# Patient Record
Sex: Female | Born: 1940
Health system: Southern US, Community
[De-identification: ages and names within clinical notes are randomized; demographics above are authoritative.]

## PROBLEM LIST (undated history)

## (undated) DIAGNOSIS — K589 Irritable bowel syndrome without diarrhea: Secondary | ICD-10-CM

## (undated) DIAGNOSIS — I10 Essential (primary) hypertension: Secondary | ICD-10-CM

## (undated) DIAGNOSIS — J449 Chronic obstructive pulmonary disease, unspecified: Secondary | ICD-10-CM

## (undated) DIAGNOSIS — K219 Gastro-esophageal reflux disease without esophagitis: Secondary | ICD-10-CM

## (undated) HISTORY — PX: OOPHORECTOMY: SHX86

## (undated) HISTORY — DX: Essential (primary) hypertension: I10

## (undated) HISTORY — DX: Gastro-esophageal reflux disease without esophagitis: K21.9

## (undated) HISTORY — DX: Chronic obstructive pulmonary disease, unspecified: J44.9

---

## 1997-07-14 ENCOUNTER — Ambulatory Visit (HOSPITAL_COMMUNITY): Admission: RE | Admit: 1997-07-14 | Discharge: 1997-07-14 | Payer: Self-pay | Admitting: Family Medicine

## 1997-09-04 ENCOUNTER — Other Ambulatory Visit: Admission: RE | Admit: 1997-09-04 | Discharge: 1997-09-04 | Payer: Self-pay | Admitting: Family Medicine

## 1999-10-17 ENCOUNTER — Other Ambulatory Visit: Admission: RE | Admit: 1999-10-17 | Discharge: 1999-10-17 | Payer: Self-pay | Admitting: Internal Medicine

## 2007-04-12 ENCOUNTER — Ambulatory Visit: Payer: Self-pay | Admitting: Internal Medicine

## 2007-04-12 ENCOUNTER — Ambulatory Visit: Payer: Self-pay | Admitting: *Deleted

## 2007-04-12 ENCOUNTER — Encounter (INDEPENDENT_AMBULATORY_CARE_PROVIDER_SITE_OTHER): Payer: Self-pay | Admitting: Nurse Practitioner

## 2007-04-12 LAB — CONVERTED CEMR LAB
ALT: 68 units/L — ABNORMAL HIGH (ref 0–35)
Basophils Absolute: 0 10*3/uL (ref 0.0–0.1)
Basophils Relative: 1 % (ref 0–1)
CO2: 23 meq/L (ref 19–32)
Calcium: 10.1 mg/dL (ref 8.4–10.5)
Chloride: 104 meq/L (ref 96–112)
Eosinophils Absolute: 0.1 10*3/uL (ref 0.0–0.7)
Glucose, Bld: 80 mg/dL (ref 70–99)
HDL: 52 mg/dL (ref >39)
Hemoglobin: 13.7 g/dL (ref 12.0–15.0)
MCHC: 32.8 g/dL (ref 30.0–36.0)
Potassium: 4.2 meq/L (ref 3.5–5.3)
RBC: 4.67 M/uL (ref 3.87–5.11)
Sodium: 141 meq/L (ref 135–145)
Total Protein: 7.5 g/dL (ref 6.0–8.3)
Triglycerides: 217 mg/dL — ABNORMAL HIGH (ref <150)

## 2007-06-12 ENCOUNTER — Ambulatory Visit: Payer: Self-pay | Admitting: Internal Medicine

## 2007-08-07 ENCOUNTER — Ambulatory Visit: Payer: Self-pay | Admitting: Internal Medicine

## 2007-08-07 ENCOUNTER — Encounter (INDEPENDENT_AMBULATORY_CARE_PROVIDER_SITE_OTHER): Payer: Self-pay | Admitting: Nurse Practitioner

## 2007-08-07 LAB — CONVERTED CEMR LAB
ALT: 38 units/L — ABNORMAL HIGH (ref 0–35)
BUN: 13 mg/dL (ref 6–23)
CO2: 24 meq/L (ref 19–32)
Calcium: 9.9 mg/dL (ref 8.4–10.5)
Chloride: 107 meq/L (ref 96–112)
Creatinine, Ser: 0.78 mg/dL (ref 0.40–1.20)
Potassium: 4.3 meq/L (ref 3.5–5.3)
VLDL: 31 mg/dL (ref 0–40)

## 2008-11-20 ENCOUNTER — Encounter: Payer: Self-pay | Admitting: Gastroenterology

## 2008-12-08 ENCOUNTER — Encounter: Payer: Self-pay | Admitting: Gastroenterology

## 2009-01-04 ENCOUNTER — Ambulatory Visit: Payer: Self-pay | Admitting: Gastroenterology

## 2009-01-18 ENCOUNTER — Ambulatory Visit: Payer: Self-pay | Admitting: Gastroenterology

## 2009-05-26 ENCOUNTER — Encounter: Admission: RE | Admit: 2009-05-26 | Discharge: 2009-05-26 | Payer: Self-pay | Admitting: Internal Medicine

## 2012-03-15 ENCOUNTER — Other Ambulatory Visit (HOSPITAL_COMMUNITY): Payer: Self-pay | Admitting: *Deleted

## 2012-03-18 ENCOUNTER — Ambulatory Visit (HOSPITAL_COMMUNITY)
Admission: RE | Admit: 2012-03-18 | Discharge: 2012-03-18 | Disposition: A | Discharge status: Home / Self Care | Payer: No Typology Code available for payment source | Source: Ambulatory Visit | Attending: Internal Medicine | Admitting: Internal Medicine

## 2012-03-18 DIAGNOSIS — M810 Age-related osteoporosis without current pathological fracture: Secondary | ICD-10-CM | POA: Insufficient documentation

## 2012-03-18 MED ORDER — ZOLEDRONIC ACID 5 MG/100ML IV SOLN
5.0000 mg | Freq: Once | INTRAVENOUS | Status: AC
  Administered 2012-03-18: 5 mg via INTRAVENOUS

## 2012-03-18 MED ORDER — ZOLEDRONIC ACID 5 MG/100ML IV SOLN
INTRAVENOUS | Status: AC
  Administered 2012-03-18: 5 mg via INTRAVENOUS
  Filled 2012-03-18: qty 100

## 2012-03-26 ENCOUNTER — Encounter (HOSPITAL_COMMUNITY): Payer: Self-pay

## 2013-09-11 ENCOUNTER — Ambulatory Visit (INDEPENDENT_AMBULATORY_CARE_PROVIDER_SITE_OTHER): Payer: Commercial Managed Care - HMO

## 2013-09-11 ENCOUNTER — Ambulatory Visit (INDEPENDENT_AMBULATORY_CARE_PROVIDER_SITE_OTHER): Payer: Commercial Managed Care - HMO | Admitting: Family Medicine

## 2013-09-11 ENCOUNTER — Other Ambulatory Visit: Payer: Self-pay | Admitting: Family Medicine

## 2013-09-11 VITALS — BP 136/78 | HR 86 | Temp 97.7°F | Resp 16 | Ht 63.0 in | Wt 178.2 lb

## 2013-09-11 DIAGNOSIS — M25519 Pain in unspecified shoulder: Secondary | ICD-10-CM

## 2013-09-11 DIAGNOSIS — M7581 Other shoulder lesions, right shoulder: Secondary | ICD-10-CM

## 2013-09-11 DIAGNOSIS — M25511 Pain in right shoulder: Secondary | ICD-10-CM

## 2013-09-11 DIAGNOSIS — M719 Bursopathy, unspecified: Secondary | ICD-10-CM

## 2013-09-11 DIAGNOSIS — M67919 Unspecified disorder of synovium and tendon, unspecified shoulder: Secondary | ICD-10-CM

## 2013-09-11 MED ORDER — MELOXICAM 7.5 MG PO TABS: ORAL_TABLET | ORAL | Status: DC

## 2013-09-11 MED ORDER — HYDROCODONE-ACETAMINOPHEN 5-325 MG PO TABS: 1.0000 | ORAL_TABLET | Freq: Three times a day (TID) | ORAL | Status: DC | PRN

## 2013-09-11 MED ORDER — CYCLOBENZAPRINE HCL 5 MG PO TABS: 5.0000 mg | ORAL_TABLET | Freq: Every evening | ORAL | Status: DC | PRN

## 2013-09-11 NOTE — Patient Instructions (Signed)

## 2013-09-11 NOTE — Progress Notes (Signed)
Chief Complaint:  Chief Complaint  Patient presents with  . Shoulder Pain    R shoulder pain, started this morning worse this afternoon. No injury.    HPI: Renee Luna is a 73 y.o. female who is here for acute onset of  right shoulder pain , woke up with this morning with it, She took 4 ibuprofen since she started having paina nd was able to move the shoulder a little bt better, prior to this the shoulder was stiff and was very painful when she moved it even a little bit.. She thought she heard a pop when she moves it and rolls her shoulder backwards. She had decreased ROm but now has full ROM but it is very painful to move it in certain positions so she tries not to do it. NKI, no repetitive motion  She works as a Electrical engineersecurity guard and os sits and usually plays with her phone . SHe denies any recent heavy lifting, pulling, repetitive motion. She sleeps usually on her back or her stomach but not sure if she may have slept on her right last night. . She has a hx of carpal tunnel, Pain is sharp and nagging, 5/10pain and earlier today was 11/10 . Denies weakness numbness or tinglign traveling down her neck to shoulder.  No prior inuiries.   She also has a 2 year history of left hip pain, can;t sleep on it due to pain, She is able to bear weight on it. She has lower back pain and sciatica as well but has not really dne much for it except lifestyle modifications.      Past Medical History  Diagnosis Date  . GERD (gastroesophageal reflux disease)    History reviewed. No pertinent past surgical history. History   Social History  . Marital Status: Divorced    Spouse Name: N/A    Number of Children: N/A  . Years of Education: N/A   Social History Main Topics  . Smoking status: Current Every Day Smoker -- 0.50 packs/day    Types: Cigarettes  . Smokeless tobacco: None  . Alcohol Use: No  . Drug Use: No  . Sexual Activity: None   Other Topics Concern  . None   Social History  Narrative  . None   Family History  Problem Relation Age of Onset  . Hyperlipidemia Mother   . Hypertension Father   . Diabetes Sister   . Heart disease Brother    No Known Allergies Prior to Admission medications   Medication Sig Start Date End Date Taking? Authorizing Provider  amoxicillin (AMOXIL) 500 MG tablet Take 500 mg by mouth 2 (two) times daily.   Yes Historical Provider, MD  omeprazole (PRILOSEC) 40 MG capsule Take 40 mg by mouth daily.   Yes Historical Provider, MD     ROS: The patient denies fevers, chills, night sweats, unintentional weight loss, chest pain, palpitations, wheezing, dyspnea on exertion, nausea, vomiting, abdominal pain, dysuria, hematuria, melena, acute numbness, weakness, or tingling.   All other systems have been reviewed and were otherwise negative with the exception of those mentioned in the HPI and as above.    PHYSICAL EXAM: Filed Vitals:   09/11/13 1715  BP: 136/78  Pulse: 86  Temp: 97.7 F (36.5 C)  Resp: 16   Filed Vitals:   09/11/13 1715  Height: 5\' 3"  (1.6 m)  Weight: 178 lb 3.2 oz (80.831 kg)   Body mass index is 31.57 kg/(m^2).  General: Alert, no  acute distress HEENT:  Normocephalic, atraumatic, oropharynx patent. EOMI, PERRLA Cardiovascular:  Regular rate and rhythm, no rubs murmurs or gallops.  No Carotid bruits, radial pulse intact. No pedal edema.  Respiratory: Clear to auscultation bilaterally.  No wheezes, rales, or rhonchi.  No cyanosis, no use of accessory musculature GI: No organomegaly, abdomen is soft and non-tender, positive bowel sounds.  No masses. Skin: No rashes. Neurologic: Facial musculature symmetric. Psychiatric: Patient is appropriate throughout our interaction. Lymphatic: No cervical lymphadenopathy Musculoskeletal: Gait intact. Neck exam-neg for spurling, full ROM, nontender Right shoulder -no deformities, no ecchymosis/erythema, no swelling Tender at [facility] jt and pain with adduction Decrease AROM due  to pain in IR/ ER , lift off but Full PROM  Neg Neers, Neg Hawkins Neg Speed Neg Empty Can 5/5 strength, 2/2 DTR brach/tricep Sensation intact  LABS: Results for orders placed in visit on 08/07/07  CONVERTED [facility] LAB      Result Value Ref Range   Sodium 141  135-145 meq/L   Potassium 4.3  3.5-5.3 meq/L   Chloride 107  96-112 meq/L   CO2 24  19-32 meq/L   Glucose, Bld 98  70-99 mg/dL   BUN 13  1-19 mg/dL   Creatinine, Ser 1.47  0.40-1.20 mg/dL   Total Bilirubin 0.7  0.3-1.2 mg/dL   Alkaline Phosphatase 85  39-117 units/L   AST 26  0-37 units/L   ALT 38 (*) 0-35 units/L   Total Protein 7.0  6.0-8.3 g/dL   Albumin 4.7  8.2-9.5 g/dL   Calcium 9.9  6.2-13.0 mg/dL   Cholesterol 865  7-846 mg/dL   Triglycerides 962 (*) <150 mg/dL   HDL 51  >95 mg/dL   Total CHOL/HDL Ratio 3.8 Ratio     VLDL 31  0-40 mg/dL   LDL Cholesterol 284 (*) 0-99 mg/dL     EKG/XRAY:   Primary read interpreted by Dr. Conley Rolls at [facility]. Neg for fx/dislocation + DJD   ASSESSMENT/PLAN: Encounter Diagnoses  Name Primary?  . Right shoulder pain Yes  . Rotator cuff tendonitis, right    ROM exercises She was rx norco, mobic, flexeril IF cont to worsen then consider steroid injection  F.u prn   Gross sideeffects, risk and benefits, and alternatives of medications d/w patient. Patient is aware that all medications have potential sideeffects and we are unable to predict every sideeffect or drug-drug interaction that may occur.  LE, THAO PHUONG, DO 09/11/2013 7:03 PM

## 2014-06-08 DIAGNOSIS — H25013 Cortical age-related cataract, bilateral: Secondary | ICD-10-CM | POA: Diagnosis not present

## 2014-06-08 DIAGNOSIS — H40053 Ocular hypertension, bilateral: Secondary | ICD-10-CM | POA: Diagnosis not present

## 2014-09-01 DIAGNOSIS — H524 Presbyopia: Secondary | ICD-10-CM | POA: Diagnosis not present

## 2014-09-01 DIAGNOSIS — H25013 Cortical age-related cataract, bilateral: Secondary | ICD-10-CM | POA: Diagnosis not present

## 2014-09-01 DIAGNOSIS — H5203 Hypermetropia, bilateral: Secondary | ICD-10-CM | POA: Diagnosis not present

## 2014-09-01 DIAGNOSIS — H40053 Ocular hypertension, bilateral: Secondary | ICD-10-CM | POA: Diagnosis not present

## 2014-09-01 DIAGNOSIS — H2513 Age-related nuclear cataract, bilateral: Secondary | ICD-10-CM | POA: Diagnosis not present

## 2014-12-08 DIAGNOSIS — H40053 Ocular hypertension, bilateral: Secondary | ICD-10-CM | POA: Diagnosis not present

## 2014-12-08 DIAGNOSIS — H40001 Preglaucoma, unspecified, right eye: Secondary | ICD-10-CM | POA: Diagnosis not present

## 2014-12-30 DIAGNOSIS — R8299 Other abnormal findings in urine: Secondary | ICD-10-CM | POA: Diagnosis not present

## 2014-12-30 DIAGNOSIS — M859 Disorder of bone density and structure, unspecified: Secondary | ICD-10-CM | POA: Diagnosis not present

## 2014-12-30 DIAGNOSIS — E785 Hyperlipidemia, unspecified: Secondary | ICD-10-CM | POA: Diagnosis not present

## 2014-12-30 DIAGNOSIS — K219 Gastro-esophageal reflux disease without esophagitis: Secondary | ICD-10-CM | POA: Diagnosis not present

## 2014-12-30 DIAGNOSIS — N390 Urinary tract infection, site not specified: Secondary | ICD-10-CM | POA: Diagnosis not present

## 2015-01-06 DIAGNOSIS — E669 Obesity, unspecified: Secondary | ICD-10-CM | POA: Diagnosis not present

## 2015-01-06 DIAGNOSIS — R05 Cough: Secondary | ICD-10-CM | POA: Diagnosis not present

## 2015-01-06 DIAGNOSIS — G43009 Migraine without aura, not intractable, without status migrainosus: Secondary | ICD-10-CM | POA: Diagnosis not present

## 2015-01-06 DIAGNOSIS — Z0000 Encounter for general adult medical examination without abnormal findings: Secondary | ICD-10-CM | POA: Diagnosis not present

## 2015-01-06 DIAGNOSIS — M859 Disorder of bone density and structure, unspecified: Secondary | ICD-10-CM | POA: Diagnosis not present

## 2015-01-06 DIAGNOSIS — R109 Unspecified abdominal pain: Secondary | ICD-10-CM | POA: Diagnosis not present

## 2015-01-06 DIAGNOSIS — L989 Disorder of the skin and subcutaneous tissue, unspecified: Secondary | ICD-10-CM | POA: Diagnosis not present

## 2015-01-06 DIAGNOSIS — E785 Hyperlipidemia, unspecified: Secondary | ICD-10-CM | POA: Diagnosis not present

## 2015-01-07 ENCOUNTER — Other Ambulatory Visit: Payer: Self-pay | Admitting: Internal Medicine

## 2015-01-07 DIAGNOSIS — R7401 Elevation of levels of liver transaminase levels: Secondary | ICD-10-CM

## 2015-01-07 DIAGNOSIS — R740 Nonspecific elevation of levels of transaminase and lactic acid dehydrogenase [LDH]: Principal | ICD-10-CM

## 2015-01-14 ENCOUNTER — Other Ambulatory Visit: Payer: No Typology Code available for payment source

## 2015-01-14 ENCOUNTER — Ambulatory Visit
Admission: RE | Admit: 2015-01-14 | Discharge: 2015-01-14 | Disposition: A | Discharge status: Home / Self Care | Payer: Commercial Managed Care - HMO | Source: Ambulatory Visit | Attending: Internal Medicine | Admitting: Internal Medicine

## 2015-01-14 DIAGNOSIS — K8020 Calculus of gallbladder without cholecystitis without obstruction: Secondary | ICD-10-CM | POA: Diagnosis not present

## 2015-01-14 DIAGNOSIS — R740 Nonspecific elevation of levels of transaminase and lactic acid dehydrogenase [LDH]: Principal | ICD-10-CM

## 2015-01-14 DIAGNOSIS — K130 Diseases of lips: Secondary | ICD-10-CM | POA: Diagnosis not present

## 2015-01-14 DIAGNOSIS — R7401 Elevation of levels of liver transaminase levels: Secondary | ICD-10-CM

## 2015-01-14 DIAGNOSIS — L821 Other seborrheic keratosis: Secondary | ICD-10-CM | POA: Diagnosis not present

## 2015-02-15 ENCOUNTER — Encounter: Payer: Self-pay | Admitting: Internal Medicine

## 2015-05-13 DIAGNOSIS — H2513 Age-related nuclear cataract, bilateral: Secondary | ICD-10-CM | POA: Diagnosis not present

## 2015-05-13 DIAGNOSIS — H16223 Keratoconjunctivitis sicca, not specified as Sjogren's, bilateral: Secondary | ICD-10-CM | POA: Diagnosis not present

## 2015-05-13 DIAGNOSIS — H25013 Cortical age-related cataract, bilateral: Secondary | ICD-10-CM | POA: Diagnosis not present

## 2015-05-14 DIAGNOSIS — I10 Essential (primary) hypertension: Secondary | ICD-10-CM | POA: Diagnosis not present

## 2015-05-14 DIAGNOSIS — G43009 Migraine without aura, not intractable, without status migrainosus: Secondary | ICD-10-CM | POA: Diagnosis not present

## 2015-05-14 DIAGNOSIS — E668 Other obesity: Secondary | ICD-10-CM | POA: Diagnosis not present

## 2015-05-14 DIAGNOSIS — Z6832 Body mass index (BMI) 32.0-32.9, adult: Secondary | ICD-10-CM | POA: Diagnosis not present

## 2015-05-25 DIAGNOSIS — H16223 Keratoconjunctivitis sicca, not specified as Sjogren's, bilateral: Secondary | ICD-10-CM | POA: Diagnosis not present

## 2015-05-25 DIAGNOSIS — H25013 Cortical age-related cataract, bilateral: Secondary | ICD-10-CM | POA: Diagnosis not present

## 2015-06-02 DIAGNOSIS — H16143 Punctate keratitis, bilateral: Secondary | ICD-10-CM | POA: Diagnosis not present

## 2015-06-02 DIAGNOSIS — H16223 Keratoconjunctivitis sicca, not specified as Sjogren's, bilateral: Secondary | ICD-10-CM | POA: Diagnosis not present

## 2015-06-09 DIAGNOSIS — H16223 Keratoconjunctivitis sicca, not specified as Sjogren's, bilateral: Secondary | ICD-10-CM | POA: Diagnosis not present

## 2015-06-09 DIAGNOSIS — H40001 Preglaucoma, unspecified, right eye: Secondary | ICD-10-CM | POA: Diagnosis not present

## 2015-06-09 DIAGNOSIS — H2513 Age-related nuclear cataract, bilateral: Secondary | ICD-10-CM | POA: Diagnosis not present

## 2015-06-09 DIAGNOSIS — H25013 Cortical age-related cataract, bilateral: Secondary | ICD-10-CM | POA: Diagnosis not present

## 2015-06-29 DIAGNOSIS — H16143 Punctate keratitis, bilateral: Secondary | ICD-10-CM | POA: Diagnosis not present

## 2015-06-29 DIAGNOSIS — H16223 Keratoconjunctivitis sicca, not specified as Sjogren's, bilateral: Secondary | ICD-10-CM | POA: Diagnosis not present

## 2015-08-10 DIAGNOSIS — H16223 Keratoconjunctivitis sicca, not specified as Sjogren's, bilateral: Secondary | ICD-10-CM | POA: Diagnosis not present

## 2015-08-10 DIAGNOSIS — H25013 Cortical age-related cataract, bilateral: Secondary | ICD-10-CM | POA: Diagnosis not present

## 2015-09-03 DIAGNOSIS — E668 Other obesity: Secondary | ICD-10-CM | POA: Diagnosis not present

## 2015-09-03 DIAGNOSIS — I10 Essential (primary) hypertension: Secondary | ICD-10-CM | POA: Diagnosis not present

## 2015-09-03 DIAGNOSIS — G43009 Migraine without aura, not intractable, without status migrainosus: Secondary | ICD-10-CM | POA: Diagnosis not present

## 2015-09-03 DIAGNOSIS — Z6831 Body mass index (BMI) 31.0-31.9, adult: Secondary | ICD-10-CM | POA: Diagnosis not present

## 2015-10-01 DIAGNOSIS — I10 Essential (primary) hypertension: Secondary | ICD-10-CM | POA: Diagnosis not present

## 2015-10-01 DIAGNOSIS — R0602 Shortness of breath: Secondary | ICD-10-CM | POA: Diagnosis not present

## 2015-10-01 DIAGNOSIS — Z6832 Body mass index (BMI) 32.0-32.9, adult: Secondary | ICD-10-CM | POA: Diagnosis not present

## 2015-10-01 DIAGNOSIS — M545 Low back pain: Secondary | ICD-10-CM | POA: Diagnosis not present

## 2015-10-01 DIAGNOSIS — R0789 Other chest pain: Secondary | ICD-10-CM | POA: Diagnosis not present

## 2015-10-04 DIAGNOSIS — I10 Essential (primary) hypertension: Secondary | ICD-10-CM | POA: Diagnosis not present

## 2015-10-04 DIAGNOSIS — R0602 Shortness of breath: Secondary | ICD-10-CM | POA: Diagnosis not present

## 2015-10-12 DIAGNOSIS — I10 Essential (primary) hypertension: Secondary | ICD-10-CM | POA: Diagnosis not present

## 2015-10-12 DIAGNOSIS — R5381 Other malaise: Secondary | ICD-10-CM | POA: Diagnosis not present

## 2015-10-12 DIAGNOSIS — R0602 Shortness of breath: Secondary | ICD-10-CM | POA: Diagnosis not present

## 2015-10-12 DIAGNOSIS — Z6831 Body mass index (BMI) 31.0-31.9, adult: Secondary | ICD-10-CM | POA: Diagnosis not present

## 2015-11-19 DIAGNOSIS — R0602 Shortness of breath: Secondary | ICD-10-CM | POA: Diagnosis not present

## 2015-11-19 DIAGNOSIS — R5381 Other malaise: Secondary | ICD-10-CM | POA: Diagnosis not present

## 2015-11-19 DIAGNOSIS — R0789 Other chest pain: Secondary | ICD-10-CM | POA: Diagnosis not present

## 2015-11-19 DIAGNOSIS — I10 Essential (primary) hypertension: Secondary | ICD-10-CM | POA: Diagnosis not present

## 2015-11-19 DIAGNOSIS — R062 Wheezing: Secondary | ICD-10-CM | POA: Diagnosis not present

## 2015-11-19 DIAGNOSIS — K219 Gastro-esophageal reflux disease without esophagitis: Secondary | ICD-10-CM | POA: Diagnosis not present

## 2015-11-19 DIAGNOSIS — Z6832 Body mass index (BMI) 32.0-32.9, adult: Secondary | ICD-10-CM | POA: Diagnosis not present

## 2015-12-08 DIAGNOSIS — R002 Palpitations: Secondary | ICD-10-CM | POA: Diagnosis not present

## 2015-12-08 DIAGNOSIS — R0602 Shortness of breath: Secondary | ICD-10-CM | POA: Diagnosis not present

## 2015-12-08 DIAGNOSIS — R0789 Other chest pain: Secondary | ICD-10-CM | POA: Diagnosis not present

## 2015-12-08 DIAGNOSIS — I951 Orthostatic hypotension: Secondary | ICD-10-CM | POA: Diagnosis not present

## 2015-12-10 DIAGNOSIS — R0602 Shortness of breath: Secondary | ICD-10-CM | POA: Diagnosis not present

## 2015-12-10 DIAGNOSIS — R002 Palpitations: Secondary | ICD-10-CM | POA: Diagnosis not present

## 2015-12-10 DIAGNOSIS — R0789 Other chest pain: Secondary | ICD-10-CM | POA: Diagnosis not present

## 2015-12-14 DIAGNOSIS — R0602 Shortness of breath: Secondary | ICD-10-CM | POA: Diagnosis not present

## 2015-12-14 DIAGNOSIS — R002 Palpitations: Secondary | ICD-10-CM | POA: Diagnosis not present

## 2015-12-14 DIAGNOSIS — R079 Chest pain, unspecified: Secondary | ICD-10-CM | POA: Diagnosis not present

## 2015-12-17 DIAGNOSIS — R0789 Other chest pain: Secondary | ICD-10-CM | POA: Diagnosis not present

## 2015-12-17 DIAGNOSIS — R002 Palpitations: Secondary | ICD-10-CM | POA: Diagnosis not present

## 2015-12-17 DIAGNOSIS — J449 Chronic obstructive pulmonary disease, unspecified: Secondary | ICD-10-CM | POA: Diagnosis not present

## 2015-12-17 DIAGNOSIS — R0602 Shortness of breath: Secondary | ICD-10-CM | POA: Diagnosis not present

## 2015-12-23 DIAGNOSIS — H2513 Age-related nuclear cataract, bilateral: Secondary | ICD-10-CM | POA: Diagnosis not present

## 2015-12-23 DIAGNOSIS — H16223 Keratoconjunctivitis sicca, not specified as Sjogren's, bilateral: Secondary | ICD-10-CM | POA: Diagnosis not present

## 2015-12-23 DIAGNOSIS — H40001 Preglaucoma, unspecified, right eye: Secondary | ICD-10-CM | POA: Diagnosis not present

## 2016-01-07 DIAGNOSIS — I10 Essential (primary) hypertension: Secondary | ICD-10-CM | POA: Diagnosis not present

## 2016-01-07 DIAGNOSIS — M859 Disorder of bone density and structure, unspecified: Secondary | ICD-10-CM | POA: Diagnosis not present

## 2016-01-07 DIAGNOSIS — E784 Other hyperlipidemia: Secondary | ICD-10-CM | POA: Diagnosis not present

## 2016-01-14 DIAGNOSIS — M4120 Other idiopathic scoliosis, site unspecified: Secondary | ICD-10-CM | POA: Diagnosis not present

## 2016-01-14 DIAGNOSIS — R05 Cough: Secondary | ICD-10-CM | POA: Diagnosis not present

## 2016-01-14 DIAGNOSIS — Z0000 Encounter for general adult medical examination without abnormal findings: Secondary | ICD-10-CM | POA: Diagnosis not present

## 2016-01-14 DIAGNOSIS — M859 Disorder of bone density and structure, unspecified: Secondary | ICD-10-CM | POA: Diagnosis not present

## 2016-01-14 DIAGNOSIS — E668 Other obesity: Secondary | ICD-10-CM | POA: Diagnosis not present

## 2016-01-14 DIAGNOSIS — K219 Gastro-esophageal reflux disease without esophagitis: Secondary | ICD-10-CM | POA: Diagnosis not present

## 2016-01-14 DIAGNOSIS — I10 Essential (primary) hypertension: Secondary | ICD-10-CM | POA: Diagnosis not present

## 2016-01-14 DIAGNOSIS — E784 Other hyperlipidemia: Secondary | ICD-10-CM | POA: Diagnosis not present

## 2016-01-14 DIAGNOSIS — R0602 Shortness of breath: Secondary | ICD-10-CM | POA: Diagnosis not present

## 2016-01-27 ENCOUNTER — Other Ambulatory Visit (HOSPITAL_COMMUNITY): Payer: Self-pay | Admitting: Respiratory Therapy

## 2016-01-27 DIAGNOSIS — R0789 Other chest pain: Secondary | ICD-10-CM

## 2016-01-27 DIAGNOSIS — R0602 Shortness of breath: Secondary | ICD-10-CM

## 2016-03-01 ENCOUNTER — Ambulatory Visit (HOSPITAL_COMMUNITY)
Admission: RE | Admit: 2016-03-01 | Discharge: 2016-03-01 | Disposition: A | Discharge status: Home / Self Care | Payer: Commercial Managed Care - HMO | Source: Ambulatory Visit | Attending: Internal Medicine | Admitting: Internal Medicine

## 2016-03-01 DIAGNOSIS — R0789 Other chest pain: Secondary | ICD-10-CM | POA: Diagnosis not present

## 2016-03-01 DIAGNOSIS — R0602 Shortness of breath: Secondary | ICD-10-CM | POA: Insufficient documentation

## 2016-03-01 LAB — PULMONARY FUNCTION TEST
DL/VA % PRED: 86 %
DL/VA: 4.03 ml/min/mmHg/L
DLCO UNC % PRED: 72 %
DLCO UNC: 16.72 ml/min/mmHg
FEF 25-75 POST: 2.08 L/s
FEF 25-75 Pre: 2.81 L/sec
FEF2575-%Change-Post: -26 %
FEF2575-%PRED-POST: 122 %
FEF2575-%PRED-PRE: 166 %
FEV1-%Change-Post: -5 %
FEV1-%Pred-Post: 98 %
FEV1-%Pred-Pre: 104 %
FEV1-Post: 2.03 L
FEV1-Pre: 2.16 L
FEV1FVC-%Change-Post: 0 %
FEV1FVC-%PRED-PRE: 113 %
FEV6-%CHANGE-POST: -5 %
FEV6-%PRED-PRE: 96 %
FEV6-%Pred-Post: 90 %
FEV6-POST: 2.38 L
FEV6-Pre: 2.53 L
FEV6FVC-%PRED-POST: 105 %
FEV6FVC-%Pred-Pre: 105 %
FVC-%Change-Post: -5 %
FVC-%PRED-POST: 86 %
FVC-%Pred-Pre: 91 %
FVC-PRE: 2.53 L
FVC-Post: 2.38 L
POST FEV6/FVC RATIO: 100 %
PRE FEV1/FVC RATIO: 85 %
Post FEV1/FVC ratio: 85 %
Pre FEV6/FVC Ratio: 100 %
RV % pred: 75 %
RV: 1.66 L
TLC % PRED: 91 %
TLC: 4.5 L

## 2016-03-01 MED ORDER — ALBUTEROL SULFATE (2.5 MG/3ML) 0.083% IN NEBU
2.5000 mg | INHALATION_SOLUTION | Freq: Once | RESPIRATORY_TRACT | Status: AC
  Administered 2016-03-01: 2.5 mg via RESPIRATORY_TRACT

## 2016-04-11 DIAGNOSIS — Z6833 Body mass index (BMI) 33.0-33.9, adult: Secondary | ICD-10-CM | POA: Diagnosis not present

## 2016-04-11 DIAGNOSIS — I10 Essential (primary) hypertension: Secondary | ICD-10-CM | POA: Diagnosis not present

## 2016-04-11 DIAGNOSIS — R5381 Other malaise: Secondary | ICD-10-CM | POA: Diagnosis not present

## 2016-04-21 DIAGNOSIS — I10 Essential (primary) hypertension: Secondary | ICD-10-CM | POA: Diagnosis not present

## 2016-04-21 DIAGNOSIS — Z6832 Body mass index (BMI) 32.0-32.9, adult: Secondary | ICD-10-CM | POA: Diagnosis not present

## 2016-04-21 DIAGNOSIS — R05 Cough: Secondary | ICD-10-CM | POA: Diagnosis not present

## 2016-04-21 DIAGNOSIS — Z1389 Encounter for screening for other disorder: Secondary | ICD-10-CM | POA: Diagnosis not present

## 2016-04-21 DIAGNOSIS — R5381 Other malaise: Secondary | ICD-10-CM | POA: Diagnosis not present

## 2016-06-22 DIAGNOSIS — H2513 Age-related nuclear cataract, bilateral: Secondary | ICD-10-CM | POA: Diagnosis not present

## 2016-06-22 DIAGNOSIS — H16223 Keratoconjunctivitis sicca, not specified as Sjogren's, bilateral: Secondary | ICD-10-CM | POA: Diagnosis not present

## 2016-06-22 DIAGNOSIS — H40003 Preglaucoma, unspecified, bilateral: Secondary | ICD-10-CM | POA: Diagnosis not present

## 2016-06-22 DIAGNOSIS — H25013 Cortical age-related cataract, bilateral: Secondary | ICD-10-CM | POA: Diagnosis not present

## 2016-06-23 DIAGNOSIS — I10 Essential (primary) hypertension: Secondary | ICD-10-CM | POA: Diagnosis not present

## 2016-06-23 DIAGNOSIS — R635 Abnormal weight gain: Secondary | ICD-10-CM | POA: Diagnosis not present

## 2016-06-23 DIAGNOSIS — R5381 Other malaise: Secondary | ICD-10-CM | POA: Diagnosis not present

## 2016-06-23 DIAGNOSIS — R0602 Shortness of breath: Secondary | ICD-10-CM | POA: Diagnosis not present

## 2016-06-23 DIAGNOSIS — Z6833 Body mass index (BMI) 33.0-33.9, adult: Secondary | ICD-10-CM | POA: Diagnosis not present

## 2016-08-10 DIAGNOSIS — Z6833 Body mass index (BMI) 33.0-33.9, adult: Secondary | ICD-10-CM | POA: Diagnosis not present

## 2016-08-10 DIAGNOSIS — I10 Essential (primary) hypertension: Secondary | ICD-10-CM | POA: Diagnosis not present

## 2016-08-10 DIAGNOSIS — F4321 Adjustment disorder with depressed mood: Secondary | ICD-10-CM | POA: Diagnosis not present

## 2016-12-08 DIAGNOSIS — Z6832 Body mass index (BMI) 32.0-32.9, adult: Secondary | ICD-10-CM | POA: Diagnosis not present

## 2016-12-08 DIAGNOSIS — J440 Chronic obstructive pulmonary disease with acute lower respiratory infection: Secondary | ICD-10-CM | POA: Diagnosis not present

## 2016-12-08 DIAGNOSIS — R05 Cough: Secondary | ICD-10-CM | POA: Diagnosis not present

## 2016-12-08 DIAGNOSIS — J449 Chronic obstructive pulmonary disease, unspecified: Secondary | ICD-10-CM | POA: Diagnosis not present

## 2016-12-21 DIAGNOSIS — H25013 Cortical age-related cataract, bilateral: Secondary | ICD-10-CM | POA: Diagnosis not present

## 2016-12-21 DIAGNOSIS — H40003 Preglaucoma, unspecified, bilateral: Secondary | ICD-10-CM | POA: Diagnosis not present

## 2017-01-12 ENCOUNTER — Ambulatory Visit: Payer: Commercial Managed Care - HMO | Admitting: Emergency Medicine

## 2017-01-12 ENCOUNTER — Encounter: Payer: Self-pay | Admitting: Emergency Medicine

## 2017-01-12 ENCOUNTER — Other Ambulatory Visit: Payer: Self-pay

## 2017-01-12 ENCOUNTER — Ambulatory Visit (INDEPENDENT_AMBULATORY_CARE_PROVIDER_SITE_OTHER): Payer: Commercial Managed Care - HMO

## 2017-01-12 VITALS — BP 110/66 | HR 69 | Temp 97.9°F | Resp 16 | Ht 63.0 in | Wt 184.4 lb

## 2017-01-12 DIAGNOSIS — I10 Essential (primary) hypertension: Secondary | ICD-10-CM

## 2017-01-12 DIAGNOSIS — J449 Chronic obstructive pulmonary disease, unspecified: Secondary | ICD-10-CM | POA: Diagnosis not present

## 2017-01-12 NOTE — Patient Instructions (Addendum)
IF you received an x-ray today, you will receive an invoice from [facility]. Please contact [facility] at [phone] with questions or concerns regarding your invoice.   IF you received labwork today, you will receive an invoice from [facility]. Please contact [facility] at [phone] with questions or concerns regarding your invoice.   Our billing staff will not be able to assist you with questions regarding bills from these companies.  You will be contacted with the lab results as soon as they are available. The fastest way to get your results is to activate your My Chart account. Instructions are located on the last page of this paperwork. If you have not heard from [place] regarding the results in 2 weeks, please contact this office.     Chronic Obstructive Pulmonary Disease Chronic obstructive pulmonary disease (COPD) is a long-term (chronic) lung problem. When you have COPD, it is hard for air to get in and out of your lungs. The way your lungs work will never return to normal. Usually the condition gets worse over time. There are things you can do to keep yourself as healthy as possible. Your doctor may treat your condition with:  Medicines.  Quitting smoking, if you smoke.  Rehabilitation. This may involve a team of specialists.  Oxygen.  Exercise and changes to your diet.  Lung surgery.  Comfort measures (palliative care).  Follow these instructions at home: Medicines  Take over-the-counter and prescription medicines only as told by your doctor.  Talk to your doctor before taking any cough or allergy medicines. You may need to avoid medicines that cause your lungs to be dry. Lifestyle  If you smoke, stop. Smoking makes the problem worse. If you need help quitting, ask your doctor.  Avoid being around things that make your breathing worse. This may include smoke, chemicals, and fumes.  Stay active, but remember to also rest.  Learn and use tips  on how to relax.  Make sure you get enough sleep. Most adults need at least 7 hours a night.  Eat healthy foods. Eat smaller meals more often. Rest before meals. Controlled breathing  Learn and use tips on how to control your breathing as told by your doctor. Try: ? Breathing in (inhaling) through your nose for 1 second. Then, pucker your lips and breath out (exhale) through your lips for 2 seconds. ? Putting one hand on your belly (abdomen). Breathe in slowly through your nose for 1 second. Your hand on your belly should move out. Pucker your lips and breathe out slowly through your lips. Your hand on your belly should move in as you breathe out. Controlled coughing  Learn and use controlled coughing to clear mucus from your lungs. The steps are: 1. Lean your head a little forward. 2. Breathe in deeply. 3. Try to hold your breath for 3 seconds. 4. Keep your mouth slightly open while coughing 2 times. 5. Spit any mucus out into a tissue. 6. Rest and do the steps again 1 or 2 times as needed. General instructions  Make sure you get all the shots (vaccines) that your doctor recommends. Ask your doctor about a flu shot and a pneumonia shot.  Use oxygen therapy and therapy to help improve your lungs (pulmonary rehabilitation) if told by your doctor. If you need home oxygen therapy, ask your doctor if you should buy a tool to measure your oxygen level (oximeter).  Make a COPD action plan with your doctor. This helps you know  what to do if you feel worse than usual.  Manage any other conditions you have as told by your doctor.  Avoid going outside when it is very hot, cold, or humid.  Avoid people who have a sickness you can catch (contagious).  Keep all follow-up visits as told by your doctor. This is important. Contact a doctor if:  You cough up more mucus than usual.  There is a change in the color or thickness of the mucus.  It is harder to breathe than usual.  Your breathing  is faster than usual.  You have trouble sleeping.  You need to use your medicines more often than usual.  You have trouble doing your normal activities such as getting dressed or walking around the house. Get help right away if:  You have shortness of breath while resting.  You have shortness of breath that stops you from: ? Being able to talk. ? Doing normal activities.  Your chest hurts for longer than 5 minutes.  Your skin color is more blue than usual.  Your pulse oximeter shows that you have low oxygen for longer than 5 minutes.  You have a fever.  You feel too tired to breathe normally. Summary  Chronic obstructive pulmonary disease (COPD) is a long-term lung problem.  The way your lungs work will never return to normal. Usually the condition gets worse over time. There are things you can do to keep yourself as healthy as possible.  Take over-the-counter and prescription medicines only as told by your doctor.  If you smoke, stop. Smoking makes the problem worse. This information is not intended to replace advice given to you by your health care provider. Make sure you discuss any questions you have with your health care provider. Document Released: 08/02/2007 Document Revised: 07/22/2015 Document Reviewed: 10/10/2012 Elsevier Interactive Patient Education  2017 [employer]  Hypertension Hypertension is another name for high blood pressure. High blood pressure forces your heart to work harder to pump blood. This can cause problems over time. There are two numbers in a blood pressure reading. There is a top number (systolic) over a bottom number (diastolic). It is best to have a blood pressure below 120/80. Healthy choices can help lower your blood pressure. You may need medicine to help lower your blood pressure if:  Your blood pressure cannot be lowered with healthy choices.  Your blood pressure is higher than 130/80.  Follow these instructions at home: Eating  and drinking  If directed, follow the DASH eating plan. This diet includes: ? Filling half of your plate at each meal with fruits and vegetables. ? Filling one quarter of your plate at each meal with whole grains. Whole grains include whole wheat pasta, brown rice, and whole grain bread. ? Eating or drinking low-fat dairy products, such as skim milk or low-fat yogurt. ? Filling one quarter of your plate at each meal with low-fat (lean) proteins. Low-fat proteins include fish, skinless chicken, eggs, beans, and tofu. ? Avoiding fatty meat, cured and processed meat, or chicken with skin. ? Avoiding premade or processed food.  Eat less than 1,500 mg of salt (sodium) a day.  Limit alcohol use to no more than 1 drink a day for nonpregnant women and 2 drinks a day for men. One drink equals 12 oz of beer, 5 oz of wine, or 1 oz of hard liquor. Lifestyle  Work with your doctor to stay at a healthy weight or to lose weight. Ask your doctor what  the best weight is for you.  Get at least 30 minutes of exercise that causes your heart to beat faster (aerobic exercise) most days of the week. This may include walking, swimming, or biking.  Get at least 30 minutes of exercise that strengthens your muscles (resistance exercise) at least 3 days a week. This may include lifting weights or pilates.  Do not use any products that contain nicotine or tobacco. This includes cigarettes and e-cigarettes. If you need help quitting, ask your doctor.  Check your blood pressure at home as told by your doctor.  Keep all follow-up visits as told by your doctor. This is important. Medicines  Take over-the-counter and prescription medicines only as told by your doctor. Follow directions carefully.  Do not skip doses of blood pressure medicine. The medicine does not work as well if you skip doses. Skipping doses also puts you at risk for problems.  Ask your doctor about side effects or reactions to medicines that you  should watch for. Contact a doctor if:  You think you are having a reaction to the medicine you are taking.  You have headaches that keep coming back (recurring).  You feel dizzy.  You have swelling in your ankles.  You have trouble with your vision. Get help right away if:  You get a very bad headache.  You start to feel confused.  You feel weak or numb.  You feel faint.  You get very bad pain in your: ? Chest. ? Belly (abdomen).  You throw up (vomit) more than once.  You have trouble breathing. Summary  Hypertension is another name for high blood pressure.  Making healthy choices can help lower blood pressure. If your blood pressure cannot be controlled with healthy choices, you may need to take medicine. This information is not intended to replace advice given to you by your health care provider. Make sure you discuss any questions you have with your health care provider. Document Released: 08/02/2007 Document Revised: 01/12/2016 Document Reviewed: 01/12/2016 Elsevier Interactive Patient Education  [employer]2018 [employer]

## 2017-01-12 NOTE — Progress Notes (Signed)
Renee Luna 76 y.o.   Chief Complaint  Patient presents with  . Establish Care    discuss COPD and HTN    HISTORY OF PRESENT ILLNESS: This is a 76 y.o. female here to establish care; has h/o HTN and COPD; doing well.  HPI   Prior to Admission medications   Medication Sig Start Date End Date Taking? Authorizing Provider  Budesonide-Formoterol Fumarate (SYMBICORT IN) Inhale 2 (two) times daily into the lungs.   Yes [provider]  losartan (COZAAR) 100 MG tablet Take 100 mg daily by mouth.   Yes [provider]  metoprolol succinate (TOPROL-XL) 50 MG 24 hr tablet Take 50 mg daily by mouth. Take with or immediately following a meal.   Yes [provider]  omeprazole (PRILOSEC) 40 MG capsule Take 40 mg by mouth daily.   Yes [provider]    Not on File  Patient Active Problem List   Diagnosis Date Noted  . Essential hypertension 01/12/2017  . Chronic obstructive pulmonary disease (HCC) 01/12/2017    Past Medical History:  Diagnosis Date  . COPD (chronic obstructive pulmonary disease) (HCC)   . GERD (gastroesophageal reflux disease)   . Hypertension     No past surgical history on file.  Social History   Socioeconomic History  . Marital status: Divorced    Spouse name: Not on file  . Number of children: Not on file  . Years of education: Not on file  . Highest education level: Not on file  Social Needs  . Financial resource strain: Not on file  . Food insecurity - worry: Not on file  . Food insecurity - inability: Not on file  . Transportation needs - medical: Not on file  . Transportation needs - non-medical: Not on file  Occupational History  . Not on file  Tobacco Use  . Smoking status: Former Smoker    Packs/day: 0.50    Years: 20.00    Pack years: 10.00    Types: Cigarettes    Last attempt to quit: 03/30/2014    Years since quitting: 2.7  . Smokeless tobacco: Never Used  Substance and Sexual Activity  . Alcohol  use: No  . Drug use: No  . Sexual activity: Not on file  Other Topics Concern  . Not on file  Social History Narrative  . Not on file    Family History  Problem Relation Age of Onset  . Hyperlipidemia Mother   . Hypertension Father   . Diabetes Sister   . Hypertension Sister   . Cancer Sister        ovarin  . Cancer Sister        ovarin  . Mental illness Sister   . Hypertension Sister   . Hypertension Brother      Review of Systems  Constitutional: Negative.  Negative for chills, fever and weight loss.  HENT: Negative.  Negative for congestion, hearing loss and sore throat.   Eyes: Negative.  Negative for blurred vision, double vision, discharge and redness.  Respiratory: Positive for shortness of breath (DOE). Negative for cough.   Cardiovascular: Negative.  Negative for chest pain, palpitations and leg swelling.  Gastrointestinal: Negative.  Negative for abdominal pain, blood in stool, diarrhea, nausea and vomiting.  Genitourinary: Negative.  Negative for hematuria.  Musculoskeletal: Negative for back pain, myalgias and neck pain.  Skin: Negative.  Negative for rash.  Neurological: Negative.  Negative for dizziness, loss of consciousness and headaches.  Endo/Heme/Allergies: Negative.  All other systems reviewed and are negative.  Vitals:   01/12/17 1103  BP: 110/66  Pulse: 69  Resp: 16  Temp: 97.9 F (36.6 C)  SpO2: 97%     Physical Exam  Constitutional: She is oriented to person, place, and time. She appears well-developed and well-nourished.  HENT:  Head: Normocephalic and atraumatic.  Nose: Nose normal.  Mouth/Throat: Oropharynx is clear and moist.  Eyes: Conjunctivae and EOM are normal. Pupils are equal, round, and reactive to light.  Neck: Normal range of motion. Neck supple. No JVD present. No thyromegaly present.  Cardiovascular: Normal rate, regular rhythm and normal heart sounds.  Pulmonary/Chest: Effort normal and breath sounds normal.    Abdominal: Soft. Bowel sounds are normal. She exhibits no distension. There is no tenderness.  Musculoskeletal: Normal range of motion.  Lymphadenopathy:    She has no cervical adenopathy.  Neurological: She is alert and oriented to person, place, and time. No sensory deficit. She exhibits normal muscle tone. Coordination normal.  Skin: Skin is warm and dry. Capillary refill takes less than 2 seconds. No rash noted.  Psychiatric: She has a normal mood and affect. Her behavior is normal.  Vitals reviewed.    ASSESSMENT & PLAN: Fulton Molelice was seen today for establish care.  Diagnoses and all orders for this visit:  Essential hypertension -     CBC with Differential/Platelet -     Comprehensive metabolic panel -     Hemoglobin A1c -     DG Chest 2 View; Future  Chronic obstructive pulmonary disease, unspecified COPD type (HCC) -     CBC with Differential/Platelet -     Comprehensive metabolic panel -     Hemoglobin A1c -     DG Chest 2 View; Future -     Ambulatory referral to Pulmonology    Patient Instructions       IF you received an x-ray today, you will receive an invoice from [facility]. Please contact [facility] at [phone] with questions or concerns regarding your invoice.   IF you received labwork today, you will receive an invoice from [facility]. Please contact [facility] at [phone] with questions or concerns regarding your invoice.   Our billing staff will not be able to assist you with questions regarding bills from these companies.  You will be contacted with the lab results as soon as they are available. The fastest way to get your results is to activate your My Chart account. Instructions are located on the last page of this paperwork. If you have not heard from us regarding the results in 2 weeks, please contact this office.     Chronic Obstructive Pulmonary Disease Chronic obstructive pulmonary disease (COPD) is a long-term  (chronic) lung problem. When you have COPD, it is hard for air to get in and out of your lungs. The way your lungs work will never return to normal. Usually the condition gets worse over time. There are things you can do to keep yourself as healthy as possible. Your doctor may treat your condition with:  Medicines.  Quitting smoking, if you smoke.  Rehabilitation. This may involve a team of specialists.  Oxygen.  Exercise and changes to your diet.  Lung surgery.  Comfort measures (palliative care).  Follow these instructions at home: Medicines  Take over-the-counter and prescription medicines only as told by your doctor.  Talk to your doctor before taking any cough or allergy medicines. You may need to avoid medicines that cause your  lungs to be dry. Lifestyle  If you smoke, stop. Smoking makes the problem worse. If you need help quitting, ask your doctor.  Avoid being around things that make your breathing worse. This may include smoke, chemicals, and fumes.  Stay active, but remember to also rest.  Learn and use tips on how to relax.  Make sure you get enough sleep. Most adults need at least 7 hours a night.  Eat healthy foods. Eat smaller meals more often. Rest before meals. Controlled breathing  Learn and use tips on how to control your breathing as told by your doctor. Try: ? Breathing in (inhaling) through your nose for 1 second. Then, pucker your lips and breath out (exhale) through your lips for 2 seconds. ? Putting one hand on your belly (abdomen). Breathe in slowly through your nose for 1 second. Your hand on your belly should move out. Pucker your lips and breathe out slowly through your lips. Your hand on your belly should move in as you breathe out. Controlled coughing  Learn and use controlled coughing to clear mucus from your lungs. The steps are: 1. Lean your head a little forward. 2. Breathe in deeply. 3. Try to hold your breath for 3 seconds. 4. Keep  your mouth slightly open while coughing 2 times. 5. Spit any mucus out into a tissue. 6. Rest and do the steps again 1 or 2 times as needed. General instructions  Make sure you get all the shots (vaccines) that your doctor recommends. Ask your doctor about a flu shot and a pneumonia shot.  Use oxygen therapy and therapy to help improve your lungs (pulmonary rehabilitation) if told by your doctor. If you need home oxygen therapy, ask your doctor if you should buy a tool to measure your oxygen level (oximeter).  Make a COPD action plan with your doctor. This helps you know what to do if you feel worse than usual.  Manage any other conditions you have as told by your doctor.  Avoid going outside when it is very hot, cold, or humid.  Avoid people who have a sickness you can catch (contagious).  Keep all follow-up visits as told by your doctor. This is important. Contact a doctor if:  You cough up more mucus than usual.  There is a change in the color or thickness of the mucus.  It is harder to breathe than usual.  Your breathing is faster than usual.  You have trouble sleeping.  You need to use your medicines more often than usual.  You have trouble doing your normal activities such as getting dressed or walking around the house. Get help right away if:  You have shortness of breath while resting.  You have shortness of breath that stops you from: ? Being able to talk. ? Doing normal activities.  Your chest hurts for longer than 5 minutes.  Your skin color is more blue than usual.  Your pulse oximeter shows that you have low oxygen for longer than 5 minutes.  You have a fever.  You feel too tired to breathe normally. Summary  Chronic obstructive pulmonary disease (COPD) is a long-term lung problem.  The way your lungs work will never return to normal. Usually the condition gets worse over time. There are things you can do to keep yourself as healthy as  possible.  Take over-the-counter and prescription medicines only as told by your doctor.  If you smoke, stop. Smoking makes the problem worse. This information is not  intended to replace advice given to you by your health care provider. Make sure you discuss any questions you have with your health care provider. Document Released: 08/02/2007 Document Revised: 07/22/2015 Document Reviewed: 10/10/2012 Elsevier Interactive Patient Education  2017 Elsevier Inc.  Hypertension Hypertension is another name for high blood pressure. High blood pressure forces your heart to work harder to pump blood. This can cause problems over time. There are two numbers in a blood pressure reading. There is a top number (systolic) over a bottom number (diastolic). It is best to have a blood pressure below 120/80. Healthy choices can help lower your blood pressure. You may need medicine to help lower your blood pressure if:  Your blood pressure cannot be lowered with healthy choices.  Your blood pressure is higher than 130/80.  Follow these instructions at home: Eating and drinking  If directed, follow the DASH eating plan. This diet includes: ? Filling half of your plate at each meal with fruits and vegetables. ? Filling one quarter of your plate at each meal with whole grains. Whole grains include whole wheat pasta, brown rice, and whole grain bread. ? Eating or drinking low-fat dairy products, such as skim milk or low-fat yogurt. ? Filling one quarter of your plate at each meal with low-fat (lean) proteins. Low-fat proteins include fish, skinless chicken, eggs, beans, and tofu. ? Avoiding fatty meat, cured and processed meat, or chicken with skin. ? Avoiding premade or processed food.  Eat less than 1,500 mg of salt (sodium) a day.  Limit alcohol use to no more than 1 drink a day for nonpregnant women and 2 drinks a day for men. One drink equals 12 oz of beer, 5 oz of wine, or 1 oz of hard  liquor. Lifestyle  Work with your doctor to stay at a healthy weight or to lose weight. Ask your doctor what the best weight is for you.  Get at least 30 minutes of exercise that causes your heart to beat faster (aerobic exercise) most days of the week. This may include walking, swimming, or biking.  Get at least 30 minutes of exercise that strengthens your muscles (resistance exercise) at least 3 days a week. This may include lifting weights or pilates.  Do not use any products that contain nicotine or tobacco. This includes cigarettes and e-cigarettes. If you need help quitting, ask your doctor.  Check your blood pressure at home as told by your doctor.  Keep all follow-up visits as told by your doctor. This is important. Medicines  Take over-the-counter and prescription medicines only as told by your doctor. Follow directions carefully.  Do not skip doses of blood pressure medicine. The medicine does not work as well if you skip doses. Skipping doses also puts you at risk for problems.  Ask your doctor about side effects or reactions to medicines that you should watch for. Contact a doctor if:  You think you are having a reaction to the medicine you are taking.  You have headaches that keep coming back (recurring).  You feel dizzy.  You have swelling in your ankles.  You have trouble with your vision. Get help right away if:  You get a very bad headache.  You start to feel confused.  You feel weak or numb.  You feel faint.  You get very bad pain in your: ? Chest. ? Belly (abdomen).  You throw up (vomit) more than once.  You have trouble breathing. Summary  Hypertension is another name for  high blood pressure.  Making healthy choices can help lower blood pressure. If your blood pressure cannot be controlled with healthy choices, you may need to take medicine. This information is not intended to replace advice given to you by your health care provider. Make sure  you discuss any questions you have with your health care provider. Document Released: 08/02/2007 Document Revised: 01/12/2016 Document Reviewed: 01/12/2016 Elsevier Interactive Patient Education  2018 Elsevier Inc.      Edwina Barth, MD [facility]

## 2017-01-13 LAB — COMPREHENSIVE METABOLIC PANEL
ALBUMIN: 4.8 g/dL (ref 3.5–4.8)
ALK PHOS: 68 IU/L (ref 39–117)
ALT: 42 IU/L — AB (ref 0–32)
AST: 36 IU/L (ref 0–40)
Albumin/Globulin Ratio: 2.1 (ref 1.2–2.2)
BILIRUBIN TOTAL: 0.5 mg/dL (ref 0.0–1.2)
BUN/Creatinine Ratio: 13 (ref 12–28)
BUN: 12 mg/dL (ref 8–27)
CHLORIDE: 105 mmol/L (ref 96–106)
CO2: 21 mmol/L (ref 20–29)
Calcium: 9.4 mg/dL (ref 8.7–10.3)
Creatinine, Ser: 0.94 mg/dL (ref 0.57–1.00)
GFR calc Af Amer: 69 mL/min/{1.73_m2} (ref >59)
GFR calc non Af Amer: 60 mL/min/{1.73_m2} (ref >59)
GLUCOSE: 93 mg/dL (ref 65–99)
Globulin, Total: 2.3 g/dL (ref 1.5–4.5)
POTASSIUM: 4.1 mmol/L (ref 3.5–5.2)
Sodium: 145 mmol/L — ABNORMAL HIGH (ref 134–144)
Total Protein: 7.1 g/dL (ref 6.0–8.5)

## 2017-01-13 LAB — CBC WITH DIFFERENTIAL/PLATELET
BASOS ABS: 0.1 10*3/uL (ref 0.0–0.2)
Basos: 1 %
EOS (ABSOLUTE): 0.1 10*3/uL (ref 0.0–0.4)
Eos: 1 %
HEMOGLOBIN: 13.4 g/dL (ref 11.1–15.9)
Hematocrit: 41.1 % (ref 34.0–46.6)
Immature Grans (Abs): 0 10*3/uL (ref 0.0–0.1)
Immature Granulocytes: 0 %
LYMPHS ABS: 3.3 10*3/uL — AB (ref 0.7–3.1)
Lymphs: 37 %
MCH: 29.1 pg (ref 26.6–33.0)
MCHC: 32.6 g/dL (ref 31.5–35.7)
MCV: 89 fL (ref 79–97)
MONOCYTES: 6 %
Monocytes Absolute: 0.5 10*3/uL (ref 0.1–0.9)
NEUTROS ABS: 4.9 10*3/uL (ref 1.4–7.0)
Neutrophils: 55 %
PLATELETS: 259 10*3/uL (ref 150–379)
RBC: 4.61 x10E6/uL (ref 3.77–5.28)
RDW: 14.2 % (ref 12.3–15.4)
WBC: 8.8 10*3/uL (ref 3.4–10.8)

## 2017-01-13 LAB — HEMOGLOBIN A1C
ESTIMATED AVERAGE GLUCOSE: 117 mg/dL
Hgb A1c MFr Bld: 5.7 % — ABNORMAL HIGH (ref 4.8–5.6)

## 2017-02-13 ENCOUNTER — Ambulatory Visit: Payer: Medicare HMO | Admitting: Emergency Medicine

## 2017-03-02 ENCOUNTER — Institutional Professional Consult (permissible substitution): Payer: Commercial Managed Care - HMO | Admitting: Pulmonary Disease

## 2017-04-06 ENCOUNTER — Institutional Professional Consult (permissible substitution): Payer: Commercial Managed Care - HMO | Admitting: Pulmonary Disease

## 2017-05-22 DIAGNOSIS — H40001 Preglaucoma, unspecified, right eye: Secondary | ICD-10-CM | POA: Diagnosis not present

## 2017-05-22 DIAGNOSIS — H40003 Preglaucoma, unspecified, bilateral: Secondary | ICD-10-CM | POA: Diagnosis not present

## 2017-05-22 DIAGNOSIS — H25013 Cortical age-related cataract, bilateral: Secondary | ICD-10-CM | POA: Diagnosis not present

## 2017-05-22 DIAGNOSIS — H2513 Age-related nuclear cataract, bilateral: Secondary | ICD-10-CM | POA: Diagnosis not present

## 2017-05-24 ENCOUNTER — Ambulatory Visit: Payer: Medicare HMO | Admitting: Pulmonary Disease

## 2017-05-24 ENCOUNTER — Other Ambulatory Visit (INDEPENDENT_AMBULATORY_CARE_PROVIDER_SITE_OTHER): Payer: Medicare HMO

## 2017-05-24 ENCOUNTER — Institutional Professional Consult (permissible substitution): Payer: Commercial Managed Care - HMO | Admitting: Pulmonary Disease

## 2017-05-24 ENCOUNTER — Encounter: Payer: Self-pay | Admitting: Pulmonary Disease

## 2017-05-24 VITALS — BP 118/72 | HR 88 | Ht 63.5 in | Wt 186.0 lb

## 2017-05-24 DIAGNOSIS — J449 Chronic obstructive pulmonary disease, unspecified: Secondary | ICD-10-CM

## 2017-05-24 LAB — CBC WITH DIFFERENTIAL/PLATELET
BASOS ABS: 0.1 10*3/uL (ref 0.0–0.1)
Basophils Relative: 1 % (ref 0.0–3.0)
EOS ABS: 0.1 10*3/uL (ref 0.0–0.7)
Eosinophils Relative: 1.4 % (ref 0.0–5.0)
HEMATOCRIT: 41.2 % (ref 36.0–46.0)
Hemoglobin: 13.8 g/dL (ref 12.0–15.0)
LYMPHS PCT: 30.9 % (ref 12.0–46.0)
Lymphs Abs: 2.9 10*3/uL (ref 0.7–4.0)
MCHC: 33.6 g/dL (ref 30.0–36.0)
MCV: 85.5 fl (ref 78.0–100.0)
Monocytes Absolute: 0.6 10*3/uL (ref 0.1–1.0)
Monocytes Relative: 6.5 % (ref 3.0–12.0)
NEUTROS ABS: 5.6 10*3/uL (ref 1.4–7.7)
Neutrophils Relative %: 60.2 % (ref 43.0–77.0)
PLATELETS: 259 10*3/uL (ref 150.0–400.0)
RBC: 4.82 Mil/uL (ref 3.87–5.11)
RDW: 13.6 % (ref 11.5–15.5)
WBC: 9.4 10*3/uL (ref 4.0–10.5)

## 2017-05-24 NOTE — Progress Notes (Signed)
Renee Luna    [id number]    [birth year]  Primary Care Physician:Paterson, Reuel Boomaniel, MD  Referring Physician: Jarome MatinPaterson, Daniel, MD [address] [place], [place] [address]  Chief complaint: Consult for COPD  HPI: 77 year old with history of hypertension, allergies.  Diagnosed with COPD in 2018.  She has been maintained on Symbicort which helps with her symptoms.  She tried to stop the steroid inhaler and try anoro but had worsening dyspnea and had to go back on Symbicort  Chief complaint is dyspnea with activity.  Denies dyspnea at rest.  Has nonproductive cough with occasional wheeze.  No mucus production, fevers, chills.  Pets: 2 cats.  No birds, farm animals Occupation: Works as a Engineer, materialssecurity officer for a [employer] Exposures: No known exposures, no mold, hot tub Smoking history: 5 pack-year smoking history.  Quit in 2018 Travel History: Grew up in [place]  [place]  No significant travel.  Outpatient Encounter Medications as of 05/24/2017  Medication Sig  . Budesonide-Formoterol Fumarate (SYMBICORT IN) Inhale 2 (two) times daily into the lungs.  Marland Kitchen. buPROPion (WELLBUTRIN SR) 150 MG 12 hr tablet Take 150 mg by mouth every morning.  Marland Kitchen. losartan (COZAAR) 100 MG tablet Take 100 mg daily by mouth.  . metoprolol succinate (TOPROL-XL) 50 MG 24 hr tablet Take 50 mg daily by mouth. Take with or immediately following a meal.  . omeprazole (PRILOSEC) 40 MG capsule Take 40 mg by mouth daily.  . [DISCONTINUED] SYMBICORT 160-4.5 MCG/ACT inhaler INL 1 TO 2 PFS PO BID   No facility-administered encounter medications on file as of 05/24/2017.     Allergies as of 05/24/2017  . (Not on File)    Past Medical History:  Diagnosis Date  . COPD (chronic obstructive pulmonary disease) (HCC)   . GERD (gastroesophageal reflux disease)   . Hypertension     Past Surgical History:  Procedure Laterality Date  . OOPHORECTOMY      Family History  Problem  Relation Age of Onset  . Hyperlipidemia Mother   . Hypertension Father   . Diabetes Sister   . Hypertension Sister   . Cancer Sister        ovarin  . Cancer Sister        ovarin  . Mental illness Sister   . Hypertension Sister   . Hypertension Brother     Social History   Socioeconomic History  . Marital status: Divorced    Spouse name: Not on file  . Number of children: Not on file  . Years of education: Not on file  . Highest education level: Not on file  Occupational History  . Not on file  Social Needs  . Financial resource strain: Not on file  . Food insecurity:    Worry: Not on file    Inability: Not on file  . Transportation needs:    Medical: Not on file    Non-medical: Not on file  Tobacco Use  . Smoking status: Former Smoker    Packs/day: 0.50    Years: 20.00    Pack years: 10.00    Types: Cigarettes    Last attempt to quit: 03/30/2014    Years since quitting: 3.1  . Smokeless tobacco: Never Used  Substance and Sexual Activity  . Alcohol use: No  . Drug use: No  . Sexual activity: Not on file  Lifestyle  . Physical activity:    Days per week: Not on  file    Minutes per session: Not on file  . Stress: Not on file  Relationships  . Social connections:    Talks on phone: Not on file    Gets together: Not on file    Attends religious service: Not on file    Active member of club or organization: Not on file    Attends meetings of clubs or organizations: Not on file    Relationship status: Not on file  . Intimate partner violence:    Fear of current or ex partner: Not on file    Emotionally abused: Not on file    Physically abused: Not on file    Forced sexual activity: Not on file  Other Topics Concern  . Not on file  Social History Narrative  . Not on file    Review of systems: Review of Systems  Constitutional: Negative for fever and chills.  HENT: Negative.   Eyes: Negative for blurred vision.  Respiratory: as per HPI  Cardiovascular:  Negative for chest pain and palpitations.  Gastrointestinal: Negative for vomiting, diarrhea, blood per rectum. Genitourinary: Negative for dysuria, urgency, frequency and hematuria.  Musculoskeletal: Negative for myalgias, back pain and joint pain.  Skin: Negative for itching and rash.  Neurological: Negative for dizziness, tremors, focal weakness, seizures and loss of consciousness.  Endo/Heme/Allergies: Negative for environmental allergies.  Psychiatric/Behavioral: Negative for depression, suicidal ideas and hallucinations.  All other systems reviewed and are negative.  Physical Exam: Blood pressure 118/72, pulse 88, height 5' 3.5" (1.613 m), weight 186 lb (84.4 kg), SpO2 96 %. Gen:      No acute distress HEENT:  EOMI, sclera anicteric Neck:     No masses; no thyromegaly Lungs:    Clear to auscultation bilaterally; normal respiratory effort CV:         Regular rate and rhythm; no murmurs Abd:      + bowel sounds; soft, non-tender; no palpable masses, no distension Ext:    No edema; adequate peripheral perfusion Skin:      Warm and dry; no rash Neuro: alert and oriented x 3 Psych: normal mood and affect  Data Reviewed: CBC differential 01/12/17-WBC 8.8, eos 0.5%, absolute eosinophil count 100  PFTs 03/01/16 FVC 2.38 [86%], FEV1 2.03 [98%], F/F 85, TLC 91%, DLCO 72% Minimal diffusion defect.  Chest x-ray 01/12/17- no active cardiopulmonary disease, scoliosis and kyphosis of thoracolumbar spine.  I have reviewed the images personally.  FENO 05/24/17-24  Assessment:  Consult for COPD Although she has a diagnosis of COPD she has minimal smoking history and PFTs with no significant obstruction. She appears to have done well with Symbicort with worsening of symptoms when the inhaler was stopped.  We will continue the same Evaluate for reactive airway disease, asthma with CBC differential, blood allergy profile and repeat pulmonary function test.  Plan/Recommendations: - Continue  Symbicort - Check CBC differential, blood allergy profile, PFTs  Chilton Greathouse MD [facility] Pager [phone] 05/24/2017, 11:48 AM  CC: Jarome Matin, MD

## 2017-05-24 NOTE — Patient Instructions (Signed)
We will check a FENO today Check CBC differential, blood allergy profile Continue the Symbicort Follow-up in 3 months after pulmonary function test.

## 2017-05-25 LAB — RESPIRATORY ALLERGY PROFILE REGION II ~~LOC~~
Allergen, A. alternata, m6: <0.1 kU/L
Allergen, Cedar tree, t12: <0.1 kU/L
Allergen, Comm Silver Birch, t9: <0.1 kU/L
Allergen, D pternoyssinus,d7: <0.1 kU/L
Allergen, Mouse Urine Protein, e78: <0.1 kU/L
Allergen, Oak,t7: <0.1 kU/L
Allergen, P. notatum, m1: <0.1 kU/L
CLADOSPORIUM HERBARUM (M2) IGE: <0.1 kU/L
CLASS: 0
CLASS: 0
CLASS: 0
CLASS: 0
CLASS: 0
CLASS: 0
CLASS: 0
CLASS: 0
CLASS: 0
COMMON RAGWEED (SHORT) (W1) IGE: <0.1 kU/L
Cat Dander: <0.1 kU/L
Class: 0
Class: 0
Class: 0
Class: 0
Class: 0
Class: 0
Class: 0
Class: 0
Class: 0
Class: 0
Class: 0
Class: 0
Class: 0
Class: 0
Class: 0
Cockroach: <0.1 kU/L
D. farinae: <0.1 kU/L
IgE (Immunoglobulin E), Serum: 411 kU/L — ABNORMAL HIGH (ref <=114)
Pecan/Hickory Tree IgE: <0.1 kU/L
Timothy Grass: <0.1 kU/L

## 2017-05-25 LAB — INTERPRETATION:

## 2017-06-19 DIAGNOSIS — Z6833 Body mass index (BMI) 33.0-33.9, adult: Secondary | ICD-10-CM | POA: Diagnosis not present

## 2017-06-19 DIAGNOSIS — R05 Cough: Secondary | ICD-10-CM | POA: Diagnosis not present

## 2017-06-19 DIAGNOSIS — J449 Chronic obstructive pulmonary disease, unspecified: Secondary | ICD-10-CM | POA: Diagnosis not present

## 2017-06-19 DIAGNOSIS — R5381 Other malaise: Secondary | ICD-10-CM | POA: Diagnosis not present

## 2017-06-19 DIAGNOSIS — N390 Urinary tract infection, site not specified: Secondary | ICD-10-CM | POA: Diagnosis not present

## 2017-06-19 DIAGNOSIS — R0602 Shortness of breath: Secondary | ICD-10-CM | POA: Diagnosis not present

## 2017-07-06 ENCOUNTER — Ambulatory Visit: Payer: Medicare HMO | Admitting: Emergency Medicine

## 2017-07-07 ENCOUNTER — Encounter: Payer: Self-pay | Admitting: Physician Assistant

## 2017-07-07 ENCOUNTER — Ambulatory Visit: Payer: Medicare HMO | Admitting: Physician Assistant

## 2017-07-07 ENCOUNTER — Other Ambulatory Visit: Payer: Self-pay

## 2017-07-07 VITALS — BP 122/72 | HR 83 | Temp 98.0°F | Resp 16 | Ht 63.0 in | Wt 185.6 lb

## 2017-07-07 DIAGNOSIS — R350 Frequency of micturition: Secondary | ICD-10-CM

## 2017-07-07 LAB — POCT WET + KOH PREP
Trich by wet prep: ABSENT
Yeast by KOH: ABSENT
Yeast by wet prep: ABSENT

## 2017-07-07 LAB — POC MICROSCOPIC URINALYSIS (UMFC): MUCUS RE: ABSENT

## 2017-07-07 LAB — POCT URINALYSIS DIP (MANUAL ENTRY)
Bilirubin, UA: NEGATIVE
GLUCOSE UA: NEGATIVE mg/dL
Ketones, POC UA: NEGATIVE mg/dL
Nitrite, UA: NEGATIVE
PROTEIN UA: NEGATIVE mg/dL
Spec Grav, UA: 1.02 (ref 1.010–1.025)
UROBILINOGEN UA: 0.2 U/dL
pH, UA: 6 (ref 5.0–8.0)

## 2017-07-07 MED ORDER — CEPHALEXIN 500 MG PO CAPS: 500.0000 mg | ORAL_CAPSULE | Freq: Three times a day (TID) | ORAL | 0 refills | Status: AC

## 2017-07-07 MED ORDER — CEPHALEXIN 500 MG PO CAPS: 500.0000 mg | ORAL_CAPSULE | Freq: Three times a day (TID) | ORAL | 0 refills | Status: DC

## 2017-07-07 NOTE — Progress Notes (Signed)
07/07/2017 10:14 AM   DOB: 08-Jun-1940 / MRN: [id number]  SUBJECTIVE:  Renee Luna is a 77 y.o. female presenting for solitary episode of "blood in the toilet."  States this occurred during urination.  She is not sure if the blood is coming from her urethra or her vagina.  She has a long history of smoking.  She has a history of multiple UTIs.  She is certain she is not bleeding from her rectum and tells me "my stools have been green."  She has No Known Allergies.   She  has a past medical history of COPD (chronic obstructive pulmonary disease) (HCC), GERD (gastroesophageal reflux disease), and Hypertension.    She  reports that she quit smoking about 3 years ago. Her smoking use included cigarettes. She has a 10.00 pack-year smoking history. She has never used smokeless tobacco. She reports that she does not drink alcohol or use drugs. She  has no sexual activity history on file. The patient  has a past surgical history that includes Oophorectomy.  Her family history includes Cancer in her sister and sister; Diabetes in her sister; Hyperlipidemia in her mother; Hypertension in her brother, father, sister, and sister; Mental illness in her sister.  Review of Systems  Constitutional: Negative for chills, diaphoresis and fever.  Respiratory: Negative for cough, hemoptysis, sputum production, shortness of breath and wheezing.   Cardiovascular: Negative for chest pain, orthopnea and leg swelling.  Gastrointestinal: Negative for nausea.  Skin: Negative for rash.  Neurological: Negative for dizziness.    The problem list and medications were reviewed and updated by myself where necessary and exist elsewhere in the encounter.   OBJECTIVE:  BP 122/72 (BP Location: Right Arm)   Pulse 83   Temp 98 F (36.7 C) (Oral)   Resp 16   Ht  (1.6 m)   Wt 185 lb 9.6 oz (84.2 kg)   SpO2 96%   BMI 32.88 kg/m   Wt Readings from Last 3 Encounters:  07/07/17 185 lb 9.6 oz (84.2 kg)  05/24/17  186 lb (84.4 kg)  01/12/17 184 lb 6.4 oz (83.6 kg)   Temp Readings from Last 3 Encounters:  07/07/17 98 F (36.7 C) (Oral)  01/12/17 97.9 F (36.6 C) (Oral)  09/11/13 97.7 F (36.5 C) (Oral)   BP Readings from Last 3 Encounters:  07/07/17 122/72  05/24/17 118/72  01/12/17 110/66   Pulse Readings from Last 3 Encounters:  07/07/17 83  05/24/17 88  01/12/17 69     Physical Exam  Constitutional: She is oriented to person, place, and time. She appears well-developed.  Eyes: Pupils are equal, round, and reactive to light. EOM are normal.  Cardiovascular: Normal rate. Exam reveals no gallop and no friction rub.  No murmur heard. Pulmonary/Chest: Effort normal and breath sounds normal.  Abdominal: She exhibits no distension and no mass. There is no tenderness. There is no rebound and no guarding. No hernia.  Genitourinary: Vagina normal. No vaginal discharge (negative for bleeding) found.  Musculoskeletal: Normal range of motion.  Neurological: She is alert and oriented to person, place, and time. No cranial nerve deficit.  Skin: Skin is warm and dry. She is not diaphoretic.  Psychiatric: She has a normal mood and affect.  Vitals reviewed.   Results for orders placed or performed in visit on 07/07/17 (from the past 72 hour(s))  POCT urinalysis dipstick     Status: Abnormal   Collection Time: 07/07/17  8:54 AM  Result Value  Ref Range   Color, UA yellow yellow   Clarity, UA clear clear   Glucose, UA negative negative mg/dL   Bilirubin, UA negative negative   Ketones, POC UA negative negative mg/dL   Spec Grav, UA 1.610 9.604 - 1.025   Blood, UA small (A) negative   pH, UA 6.0 5.0 - 8.0   Protein Ur, POC negative negative mg/dL   Urobilinogen, UA 0.2 0.2 or 1.0 E.U./dL   Nitrite, UA Negative Negative   Leukocytes, UA Small (1+) (A) Negative  POCT Microscopic Urinalysis ([facility])     Status: Abnormal   Collection Time: 07/07/17  8:57 AM  Result Value Ref Range    WBC,UR,HPF,POC Moderate (A) None WBC/hpf   RBC,UR,HPF,POC Few (A) None RBC/hpf   Bacteria None None, Too numerous to count   Mucus Absent Absent   Epithelial Cells, UR Per Microscopy Moderate (A) None, Too numerous to count cells/hpf   Lab Results  Component Value Date   CREATININE 0.94 01/12/2017    No results found.  ASSESSMENT AND PLAN:  Davion was seen today for urinary tract infection.  Diagnoses and all orders for this visit:  Urinary frequency: Vaginal exam normal and reassuring.  Likely a UTI.  Will treat and cultures are pending.  She will come back as needed.   -     POCT Microscopic Urinalysis ([facility]) -     POCT urinalysis dipstick -     cephALEXin (KEFLEX) 500 MG capsule; Take 1 capsule (500 mg total) by mouth 3 (three) times daily for 10 days. -     Urine Culture -     POCT Wet + KOH Prep    The patient is advised to call or return to clinic if she does not see an improvement in symptoms, or to seek the care of the closest emergency department if she worsens with the above plan.   Deliah Boston, MHS, PA-C Primary Care at [facility] 07/07/2017 10:14 AM

## 2017-07-07 NOTE — Patient Instructions (Addendum)
Start antibiotics. COme back if you not getting better.     IF you received an x-ray today, you will receive an invoice from [facility]. Please contact [facility] at [phone] with questions or concerns regarding your invoice.   IF you received labwork today, you will receive an invoice from [facility]. Please contact [facility] at [phone] with questions or concerns regarding your invoice.   Our billing staff will not be able to assist you with questions regarding bills from these companies.  You will be contacted with the lab results as soon as they are available. The fastest way to get your results is to activate your My Chart account. Instructions are located on the last page of this paperwork. If you have not heard from [place] regarding the results in 2 weeks, please contact this office.

## 2017-07-08 LAB — URINE CULTURE: Organism ID, Bacteria: NO GROWTH

## 2017-07-09 ENCOUNTER — Encounter: Payer: Self-pay | Admitting: *Deleted

## 2017-07-26 ENCOUNTER — Encounter (HOSPITAL_COMMUNITY): Payer: Self-pay | Admitting: *Deleted

## 2017-07-26 ENCOUNTER — Emergency Department (HOSPITAL_COMMUNITY): Payer: Medicare HMO

## 2017-07-26 ENCOUNTER — Observation Stay (HOSPITAL_COMMUNITY)
Admission: EM | Admit: 2017-07-26 | Discharge: 2017-07-27 | Disposition: A | Discharge status: Home / Self Care | Payer: Medicare HMO | Attending: Family Medicine | Admitting: Family Medicine

## 2017-07-26 DIAGNOSIS — G43909 Migraine, unspecified, not intractable, without status migrainosus: Secondary | ICD-10-CM | POA: Diagnosis not present

## 2017-07-26 DIAGNOSIS — G4489 Other headache syndrome: Secondary | ICD-10-CM | POA: Diagnosis not present

## 2017-07-26 DIAGNOSIS — R519 Headache, unspecified: Secondary | ICD-10-CM | POA: Insufficient documentation

## 2017-07-26 DIAGNOSIS — I10 Essential (primary) hypertension: Secondary | ICD-10-CM | POA: Diagnosis not present

## 2017-07-26 DIAGNOSIS — R270 Ataxia, unspecified: Secondary | ICD-10-CM | POA: Diagnosis not present

## 2017-07-26 DIAGNOSIS — J449 Chronic obstructive pulmonary disease, unspecified: Secondary | ICD-10-CM | POA: Insufficient documentation

## 2017-07-26 DIAGNOSIS — R51 Headache: Secondary | ICD-10-CM

## 2017-07-26 DIAGNOSIS — K219 Gastro-esophageal reflux disease without esophagitis: Secondary | ICD-10-CM | POA: Insufficient documentation

## 2017-07-26 DIAGNOSIS — Z87891 Personal history of nicotine dependence: Secondary | ICD-10-CM | POA: Diagnosis not present

## 2017-07-26 DIAGNOSIS — R52 Pain, unspecified: Secondary | ICD-10-CM | POA: Diagnosis not present

## 2017-07-26 DIAGNOSIS — Z79899 Other long term (current) drug therapy: Secondary | ICD-10-CM | POA: Insufficient documentation

## 2017-07-26 DIAGNOSIS — G459 Transient cerebral ischemic attack, unspecified: Principal | ICD-10-CM | POA: Insufficient documentation

## 2017-07-26 HISTORY — DX: Irritable bowel syndrome without diarrhea: K58.9

## 2017-07-26 LAB — CBC WITH DIFFERENTIAL/PLATELET
Abs Immature Granulocytes: 0.1 10*3/uL (ref 0.0–0.1)
Basophils Absolute: 0.1 10*3/uL (ref 0.0–0.1)
Basophils Relative: 1 %
Eosinophils Absolute: 0.1 10*3/uL (ref 0.0–0.7)
Eosinophils Relative: 1 %
HEMATOCRIT: 38.5 % (ref 36.0–46.0)
HEMOGLOBIN: 12.5 g/dL (ref 12.0–15.0)
IMMATURE GRANULOCYTES: 1 %
LYMPHS ABS: 2 10*3/uL (ref 0.7–4.0)
LYMPHS PCT: 23 %
MCH: 27.8 pg (ref 26.0–34.0)
MCHC: 32.5 g/dL (ref 30.0–36.0)
MCV: 85.7 fL (ref 78.0–100.0)
MONOS PCT: 6 %
Monocytes Absolute: 0.5 10*3/uL (ref 0.1–1.0)
Neutro Abs: 6.2 10*3/uL (ref 1.7–7.7)
Neutrophils Relative %: 68 %
Platelets: 256 10*3/uL (ref 150–400)
RBC: 4.49 MIL/uL (ref 3.87–5.11)
RDW: 13.6 % (ref 11.5–15.5)
WBC: 8.9 10*3/uL (ref 4.0–10.5)

## 2017-07-26 LAB — PROTIME-INR
INR: 1
Prothrombin Time: 13.1 seconds (ref 11.4–15.2)

## 2017-07-26 LAB — BASIC METABOLIC PANEL
Anion gap: 8 (ref 5–15)
BUN: 10 mg/dL (ref 6–20)
CHLORIDE: 111 mmol/L (ref 101–111)
CO2: 24 mmol/L (ref 22–32)
Calcium: 8.9 mg/dL (ref 8.9–10.3)
Creatinine, Ser: 0.82 mg/dL (ref 0.44–1.00)
GFR calc Af Amer: >60 mL/min (ref >60)
GFR calc non Af Amer: >60 mL/min (ref >60)
Glucose, Bld: 120 mg/dL — ABNORMAL HIGH (ref 65–99)
POTASSIUM: 3.6 mmol/L (ref 3.5–5.1)
Sodium: 143 mmol/L (ref 135–145)

## 2017-07-26 LAB — I-STAT TROPONIN, ED: TROPONIN I, POC: 0 ng/mL (ref 0.00–0.08)

## 2017-07-26 MED ORDER — ATORVASTATIN CALCIUM 40 MG PO TABS
40.0000 mg | ORAL_TABLET | Freq: Every day | ORAL | Status: DC
  Filled 2017-07-26 (×2): qty 1

## 2017-07-26 MED ORDER — STROKE: EARLY STAGES OF RECOVERY BOOK
Freq: Once | Status: DC
  Filled 2017-07-26: qty 1

## 2017-07-26 MED ORDER — ASPIRIN 81 MG PO CHEW
81.0000 mg | CHEWABLE_TABLET | Freq: Once | ORAL | Status: AC
  Administered 2017-07-26: 81 mg via ORAL
  Filled 2017-07-26: qty 1

## 2017-07-26 MED ORDER — LORAZEPAM 2 MG/ML IJ SOLN
1.0000 mg | Freq: Once | INTRAMUSCULAR | Status: AC
  Administered 2017-07-26: 1 mg via INTRAVENOUS
  Filled 2017-07-26: qty 1

## 2017-07-26 MED ORDER — TOPIRAMATE 25 MG PO TABS
25.0000 mg | ORAL_TABLET | Freq: Two times a day (BID) | ORAL | Status: DC | PRN
  Administered 2017-07-27: 25 mg via ORAL
  Filled 2017-07-26 (×2): qty 1

## 2017-07-26 MED ORDER — LOSARTAN POTASSIUM 50 MG PO TABS
50.0000 mg | ORAL_TABLET | Freq: Every day | ORAL | Status: DC
  Administered 2017-07-27: 50 mg via ORAL
  Filled 2017-07-26: qty 1

## 2017-07-26 MED ORDER — ACETAMINOPHEN 160 MG/5ML PO SOLN: 650.0000 mg | ORAL | Status: DC | PRN

## 2017-07-26 MED ORDER — GADOBENATE DIMEGLUMINE 529 MG/ML IV SOLN
20.0000 mL | Freq: Once | INTRAVENOUS | Status: AC
  Administered 2017-07-26: 18 mL via INTRAVENOUS

## 2017-07-26 MED ORDER — SODIUM CHLORIDE 0.9 % IV SOLN
INTRAVENOUS | Status: DC
  Administered 2017-07-26: via INTRAVENOUS

## 2017-07-26 MED ORDER — SENNOSIDES-DOCUSATE SODIUM 8.6-50 MG PO TABS: 1.0000 | ORAL_TABLET | Freq: Every evening | ORAL | Status: DC | PRN

## 2017-07-26 MED ORDER — ACETAMINOPHEN 325 MG PO TABS: 650.0000 mg | ORAL_TABLET | ORAL | Status: DC | PRN

## 2017-07-26 MED ORDER — BUDESONIDE-FORMOTEROL FUMARATE 80-4.5 MCG/ACT IN AERO
2.0000 | INHALATION_SPRAY | Freq: Two times a day (BID) | RESPIRATORY_TRACT | Status: DC
  Filled 2017-07-26: qty 6.9

## 2017-07-26 MED ORDER — PANTOPRAZOLE SODIUM 40 MG PO TBEC
40.0000 mg | DELAYED_RELEASE_TABLET | Freq: Every day | ORAL | Status: DC
  Administered 2017-07-27: 40 mg via ORAL
  Filled 2017-07-26: qty 1

## 2017-07-26 MED ORDER — ALBUTEROL SULFATE (2.5 MG/3ML) 0.083% IN NEBU: 2.5000 mg | INHALATION_SOLUTION | RESPIRATORY_TRACT | Status: DC | PRN

## 2017-07-26 MED ORDER — MOMETASONE FURO-FORMOTEROL FUM 100-5 MCG/ACT IN AERO
2.0000 | INHALATION_SPRAY | Freq: Two times a day (BID) | RESPIRATORY_TRACT | Status: DC
  Administered 2017-07-27: 2 via RESPIRATORY_TRACT
  Filled 2017-07-26: qty 8.8

## 2017-07-26 MED ORDER — METOPROLOL SUCCINATE ER 25 MG PO TB24
25.0000 mg | ORAL_TABLET | Freq: Every day | ORAL | Status: DC
  Administered 2017-07-27: 25 mg via ORAL
  Filled 2017-07-26: qty 1

## 2017-07-26 MED ORDER — ENOXAPARIN SODIUM 40 MG/0.4ML ~~LOC~~ SOLN
40.0000 mg | SUBCUTANEOUS | Status: DC
  Administered 2017-07-26: 40 mg via SUBCUTANEOUS
  Filled 2017-07-26: qty 0.4

## 2017-07-26 MED ORDER — ACETAMINOPHEN 650 MG RE SUPP
650.0000 mg | RECTAL | Status: DC | PRN
Start: 2017-07-26 — End: 2017-07-27

## 2017-07-26 NOTE — H&P (Addendum)
Family Medicine Teaching [facility] Admission History and Physical Service Pager: [phone]  Patient name: Renee Luna Medical record number: [id number] Date of birth: March 18, 1940 Age: 77 y.o. Gender: female  Primary Care Provider: Jarome Matin, MD Consultants: Neurology Code Status: full  Chief Complaint: Weakness in legs  Assessment and Plan: Renee Luna is a 77 y.o. female presenting with weakness of legs, trouble standing, and issues with dizziness. Admitted for TIA workup.  Transient ischemic attack  Gait instability Acute. No prior h/o TIA/stroke. Patient with signs and symptoms consistent with TIA given almost complete resolution of symptoms. MRI/MRA of head and neck negative for any acute ischemia. Will perform usual TIA workup. Will get echocardiogram and place on telemetry. Patient already with MRA of neck which did not show any carotid stenosis. Patient with some balance abnormality with eyes closed. Unclear if this is related to TIA or is her baseline. Patient did get ativan during MRI which could be contributory. Starting on aspirin and atorvastatin  for risk start. A1C and lipid panel pending. Not felt to be related to a seizure at all, no indication for EEG at this time. - admit to family medicine teaching service, appropriate for obs, telemetry floor, Dr. Deirdre Priest - follow up neurology recommendations, appreciate their help - starting aspirin  - lovenox for dvt ppx - atorvastatin  - follow up hgb a1c - follow up lipid panel - follow up pt/ot recs  COPD Well controlled. Takes symbicort bid and albuterol prn. Will continue symbicort and albuterol nebs as needed.  Hypertension Maximum blood pressure 167/71 and most recent 148/88. Takes metoprolol  daily, losartan  daily. Continue to monitor. PRN hydralazine as needed for sbp > 180. Could consider increase of metoprolol if persistently increased.  Migraines Takes topiramate  table bid  prn for migraine. Has chronic headache which she has never gotten treatment. - Continue topirimate  table bid - Could consider switch to propranolol for beta-blocker at discharge for migraine ppx  GERD On omeprazole as outpatient. Switching to pantoprazole for formulary.  PMH is significant for COPD, h/o viral encephalitis requiring tracheostomy, hypertension, migraines, GERD  FEN/GI: regular diet after passes swallow study Prophylaxis: lovenox  Disposition: likely home  History of Present Illness:  Renee Luna is a 77 y.o. female presenting after an episode of leg weakness, unsteady gait, and dizziness. 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 She describes these symptoms as "feeling like I just got off of a boat". These symptoms started resolving after around 20-30 minutes. She also describes a pressure in her forehead consistent with a headache around this time.   Patient brought to the emergency department as a stroke workup. By the time she was evaluated by neurology she was back to her baseline neuro status. Had an MRI/MRA head and neck performed which did not show any signs of acute ischemia. Neurology felt that patient could benefit from observation and a TIA workup and family medicine was asked to admit.  Additional workup in the ED consisted of a troponin, bmp, cbc, pt/INR, glucose. All studies normal aside from glucose of 120. EKG showed normal sinus rhythm.   Review Of Systems: Per HPI with the following additions:   Review of Systems  Constitutional: Negative for chills and fever.  HENT: Negative for ear discharge and ear pain.   Eyes: Negative for pain.  Respiratory: Negative for cough and sputum production.   Cardiovascular: Negative for chest pain and  palpitations.  Gastrointestinal: Positive for abdominal pain, diarrhea and vomiting. Negative for constipation and nausea.  Neurological: Positive for  dizziness, focal weakness and headaches. Negative for seizures and weakness.    Patient Active Problem List   Diagnosis Date Noted  . TIA (transient ischemic attack) 07/26/2017  . GERD (gastroesophageal reflux disease) 07/26/2017  . Chronic headaches 07/26/2017  . Essential hypertension 01/12/2017  . Chronic obstructive pulmonary disease (HCC) 01/12/2017    Past Medical History: Past Medical History:  Diagnosis Date  . COPD (chronic obstructive pulmonary disease) (HCC)   . GERD (gastroesophageal reflux disease)   . Hypertension   . IBS (irritable bowel syndrome)     Past Surgical History: Past Surgical History:  Procedure Laterality Date  . OOPHORECTOMY    Tracheostomy  Social History: Social History   Tobacco Use  . Smoking status: Former Smoker    Packs/day: 0.50    Years: 20.00    Pack years: 10.00    Types: Cigarettes    Last attempt to quit: 03/30/2014    Years since quitting: 3.3  . Smokeless tobacco: Never Used  Substance Use Topics  . Alcohol use: No  . Drug use: No    Family History: Family History  Problem Relation Age of Onset  . Hyperlipidemia Mother   . Hypertension Father   . Diabetes Sister   . Hypertension Sister   . Cancer Sister        ovarin  . Cancer Sister        ovarin  . Mental illness Sister   . Hypertension Sister   . Hypertension Brother     Allergies and Medications: No Known Allergies No current facility-administered medications on file prior to encounter.    Current Outpatient Medications on File Prior to Encounter  Medication Sig Dispense Refill  . B Complex-C (B-COMPLEX WITH VITAMIN C) tablet Take 1 tablet by mouth daily.    . Budesonide-Formoterol Fumarate (SYMBICORT IN) Inhale 2 (two) times daily into the lungs.    . calcium citrate-vitamin D (CITRACAL+D) 315-200 MG-UNIT tablet Take 1 tablet by mouth daily.    . Flaxseed, Linseed, (FLAXSEED OIL) 1200 MG CAPS Take 1 capsule by mouth daily.    Marland Kitchen losartan (COZAAR) 100  MG tablet Take 50 mg by mouth daily.     . metoprolol succinate (TOPROL-XL) 25 MG 24 hr tablet Take 25 mg by mouth daily. Take with or immediately following a meal.     . Multiple Vitamins-Minerals (HAIR SKIN AND NAILS FORMULA PO) Take 1 tablet by mouth daily.    . Omega-3 Fatty Acids (FISH OIL) 1200 MG CPDR Take 1 capsule by mouth daily.    Marland Kitchen omeprazole (PRILOSEC) 40 MG capsule Take 40 mg by mouth daily.    Marland Kitchen topiramate (TOPAMAX) 25 MG tablet Take 25 mg by mouth 2 (two) times daily as needed for headache.    . VENTOLIN HFA 108 (90 Base) MCG/ACT inhaler Take 1-2 puffs by mouth 4 (four) times daily as needed for shortness of breath.  4    Objective: BP (!) 148/88   Pulse 66   Temp 97.9 F (36.6 C) (Oral)   Resp 18   SpO2 96%  Exam: General: alert, oriented, no acute distress. Elderly caucasian female sitting up in bed. Eyes: eomi, perrla ENTM: geographic tongue, no tonsillar exudates, no signs of external trauma to ears or head. Scar from former trach site on neck. Neck: range of motion intact Cardiovascular: rrr, palpable pt/dp bilaterally, palpable radial pulse  bilaterally, no m/r/g Respiratory: lungs clear to auscultation bilaterally, no increased work of breathing Gastrointestinal: soft, non-tender, non-distended. MSK: 5/5 strength all muscle groups BUE, BLE. Derm: warm and dry Neuro: cn 2-12 intact, no focal neuro deficits. Diadochokinesia appropriate. Instability with standing with eyes closed. Able to stand on tip toes. Psych: appropriate, pleasant  Labs and Imaging: CBC BMET  Recent Labs  Lab 07/26/17 1228  WBC 8.9  HGB 12.5  HCT 38.5  PLT 256   Recent Labs  Lab 07/26/17 1228  NA 143  K 3.6  CL 111  CO2 24  BUN 10  CREATININE 0.82  GLUCOSE 120*  CALCIUM 8.9      Myrene Buddy, MD 07/26/2017, 4:52 PM PGY-1, [facility] Family Medicine FPTS Intern pager: [phone], text pages welcome  Upper Level Addendum: I have seen and evaluated this patient along  with Dr. Primitivo Gauze and reviewed the above note, making necessary revisions in blue.  Durward Parcel, DO [facility] Family Medicine, PGY-2

## 2017-07-26 NOTE — ED Notes (Signed)
Patient returned from MRI.

## 2017-07-26 NOTE — ED Notes (Signed)
Patient was in MRI and got very anxious, Dr. Donnald Garre aware medications given.

## 2017-07-26 NOTE — Consult Note (Addendum)
NEURO HOSPITALIST CONSULT NOTE   Requestig physician: Dr. Donnald Garre   Reason for Consult: Dizziness   History obtained from:  Patient     HPI:                                                                                                                                          Renee Luna is an 77 y.o. female with history of hypertension, COPD.  States that she was feeling fine this morning, went to work and around 0900 hrs. she suddenly and abruptly felt well pressure on the top of her head and as though her thinking was not as fast as usual.  Patient went to stand up and felt as though something posterior backwards.  She was able to lean forward and grab her desk.  She then started to try to walk and felt as though she was going side to side.  She describes as having "sea legs".  She did not feel as though she was weak in her legs she just could not keep her balance.  Patient denies any visual abnormalities, dysmetria with her arms, numbness or tingling, decreased strength, vertigo, and/or dizziness.  Patient has had viral encephalitis in the past and states she has had a chronic headache since then.  She admits to taking Tylenol or ibuprofen but only when it gets really bad.  Times lasted for approximately 20 minutes and then fully resolved  Past Medical History:  Diagnosis Date  . COPD (chronic obstructive pulmonary disease) (HCC)   . GERD (gastroesophageal reflux disease)   . Hypertension   . IBS (irritable bowel syndrome)     Past Surgical History:  Procedure Laterality Date  . OOPHORECTOMY      Family History  Problem Relation Age of Onset  . Hyperlipidemia Mother   . Hypertension Father   . Diabetes Sister   . Hypertension Sister   . Cancer Sister        ovarin  . Cancer Sister        ovarin  . Mental illness Sister   . Hypertension Sister   . Hypertension Brother           Social History:  reports that she quit smoking about 3 years ago. Her  smoking use included cigarettes. She has a 10.00 pack-year smoking history. She has never used smokeless tobacco. She reports that she does not drink alcohol or use drugs.  No Known Allergies  MEDICATIONS:  No current facility-administered medications for this encounter.    Current Outpatient Medications  Medication Sig Dispense Refill  . Budesonide-Formoterol Fumarate (SYMBICORT IN) Inhale 2 (two) times daily into the lungs.    Marland Kitchen buPROPion (WELLBUTRIN SR) 150 MG 12 hr tablet Take 150 mg by mouth every morning.  5  . losartan (COZAAR) 100 MG tablet Take 100 mg daily by mouth.    . metoprolol succinate (TOPROL-XL) 50 MG 24 hr tablet Take 50 mg daily by mouth. Take with or immediately following a meal.    . omeprazole (PRILOSEC) 40 MG capsule Take 40 mg by mouth daily.        ROS:                                                                                                                                       History obtained from the patient  General ROS: negative for - chills, fatigue, fever, night sweats, weight gain or weight loss Psychological ROS: negative for - behavioral disorder, hallucinations, memory difficulties, mood swings or suicidal ideation Ophthalmic ROS: negative for - blurry vision, double vision, eye pain or loss of vision ENT ROS: negative for - epistaxis, nasal discharge, oral lesions, sore throat, tinnitus or vertigo Allergy and Immunology ROS: negative for - hives or itchy/watery eyes Hematological and Lymphatic ROS: negative for - bleeding problems, bruising or swollen lymph nodes Endocrine ROS: negative for - galactorrhea, hair pattern changes, polydipsia/polyuria or temperature intolerance Respiratory ROS: negative for - cough, hemoptysis, shortness of breath or wheezing Cardiovascular ROS: negative for - chest pain, dyspnea on exertion, edema or  irregular heartbeat Gastrointestinal ROS: negative for - abdominal pain, diarrhea, hematemesis, nausea/vomiting or stool incontinence Genito-Urinary ROS: negative for - dysuria, hematuria, incontinence or urinary frequency/urgency Musculoskeletal ROS: negative for - joint swelling or muscular weakness Neurological ROS: as noted in HPI Dermatological ROS: negative for rash and skin lesion changes   Blood pressure (!) 167/71, pulse (!) 59, temperature 97.9 F (36.6 C), temperature source Oral, resp. rate 15, SpO2 99 %.   General Examination:                                                                                                       Physical Exam  HEENT-  Normocephalic, no lesions, without obvious abnormality.  Normal external eye and conjunctiva.   Abdomen- All 4 quadrants palpated and nontender Extremities- Warm, dry and intact Musculoskeletal-no joint tenderness, deformity or swelling Skin-warm and dry, no hyperpigmentation, vitiligo,  or suspicious lesions  Neurological Examination Mental Status: Alert, oriented, thought content appropriate.  Speech fluent without evidence of aphasia.  Able to follow 3 step commands without difficulty. Cranial Nerves: II: Visual fields grossly normal,  III,IV, VI: ptosis not present, extra-ocular motions intact bilaterally pupils equal, round, reactive to light and accommodation V,VII: smile symmetric, facial light touch sensation normal bilaterally VIII: hearing normal bilaterally IX,X: uvula rises symmetrically XI: bilateral shoulder shrug XII: midline tongue extension Motor: Right : Upper extremity   5/5    Left:     Upper extremity   5/5  Lower extremity   5/5     Lower extremity   5/5 Tone and bulk:normal tone throughout; no atrophy noted Sensory: Pinprick and light touch intact throughout, bilaterally Deep Tendon Reflexes: 2+ and symmetric throughout Plantars: Right: downgoing   Left: downgoing Cerebellar: normal  finger-to-nose,normal heel-to-shin test Gait: Patient has a normal gait and no Romberg.   Lab Results: Basic Metabolic Panel: No results for input(s): NA, K, CL, CO2, GLUCOSE, BUN, CREATININE, CALCIUM, MG, PHOS in the last 168 hours.  CBC: No results for input(s): WBC, NEUTROABS, HGB, HCT, MCV, PLT in the last 168 hours.  Cardiac Enzymes: No results for input(s): CKTOTAL, CKMB, CKMBINDEX, TROPONINI in the last 168 hours.  Lipid Panel: No results for input(s): CHOL, TRIG, HDL, CHOLHDL, VLDL, LDLCALC in the last 168 hours.  Imaging: No results found.  Assessment and plan per attending neurologist  Felicie Morn PA-C [facility] [phone]  07/26/2017, 12:41 PM  I have seen the patient and reviewed the above note.  Assessment 77 year old female who presents with transient disequilibrium without lightheadedness that did not improve when she sat or lay down.  No evidence of peripheral dysfunction on exam, and the duration is longer than I would expect for BPPV.  At this time, my suspicion is that this does represent cerebellar TIA, and I would favor admitting her to work-up for this.  Recommendations: 1. HgbA1c, fasting lipid panel 2. MRI, MRA  of the brain without contrast 3. Frequent neuro checks 4. Echocardiogram 5. Carotid dopplers 6. Prophylactic therapy-Antiplatelet med: Aspirin - dose  PO or  PR  7. Risk factor modification 8. Telemetry monitoring 9. PT consult, OT consult, Speech consult 10. please page stroke NP  Or  PA  Or MD  from 8am -4 pm as this patient will be followed by the stroke team at this point.   You can look them up on [email or web address]     Ritta Slot, MD [facility] [phone]  If 7pm- 7am, please page neurology on call as listed in AMION.

## 2017-07-26 NOTE — ED Triage Notes (Signed)
States she was at work approx. 930  Am she works in a [employer] has a sudden onset pounding pressure type feeling in the top of her head went to stand up and stumbled backwards to the right. Upon ems arrival patients gait was unsteady. Co nausea . Upon  Arrival to ed patient is alert oriented was able to stand and gait was stable patent was able to stand up and was steady on her feet. Denies pain at present

## 2017-07-26 NOTE — ED Provider Notes (Signed)
[facility] EMERGENCY DEPARTMENT Provider Note   CSN: [id number] Arrival date & time: 07/26/17  1101     History   Chief Complaint Chief Complaint  Patient presents with  . Gait Problem    HPI Daysi Boggan is a 77 y.o. female.  HPI Was at work when she got a pressure on the top of her head that she does not qualify as being really painful.  She reports she did not feel the room spinning.  She however felt profoundly off balance.  She reports that she had to hold onto things to keep herself steady and particularly she felt that she is moving to the right.  He denies she has blurred vision or double vision.  She denies was focal weakness numbness or tingling of an extremity.  She reports she is felt well before this started.  She was well yesterday.  Review of systems is negative. Past Medical History:  Diagnosis Date  . COPD (chronic obstructive pulmonary disease) (HCC)   . GERD (gastroesophageal reflux disease)   . Hypertension   . IBS (irritable bowel syndrome)     Patient Active Problem List   Diagnosis Date Noted  . Essential hypertension 01/12/2017  . Chronic obstructive pulmonary disease (HCC) 01/12/2017    Past Surgical History:  Procedure Laterality Date  . OOPHORECTOMY       OB History   None      Home Medications    Prior to Admission medications   Medication Sig Start Date End Date Taking? Authorizing Provider  Budesonide-Formoterol Fumarate (SYMBICORT IN) Inhale 2 (two) times daily into the lungs.   Yes [provider]  losartan (COZAAR) 100 MG tablet Take 50 mg by mouth daily.    Yes [provider]  metoprolol succinate (TOPROL-XL) 25 MG 24 hr tablet Take 25 mg by mouth daily. Take with or immediately following a meal.    Yes [provider]  omeprazole (PRILOSEC) 40 MG capsule Take 40 mg by mouth daily.   Yes [provider]  topiramate (TOPAMAX) 25 MG tablet Take 25 mg by mouth 2 (two) times  daily as needed for headache. 07/24/17   [provider]  VENTOLIN HFA 108 (90 Base) MCG/ACT inhaler Take 1-2 puffs by mouth 4 (four) times daily as needed for shortness of breath. 06/19/17   [provider]    Family History Family History  Problem Relation Age of Onset  . Hyperlipidemia Mother   . Hypertension Father   . Diabetes Sister   . Hypertension Sister   . Cancer Sister        ovarin  . Cancer Sister        ovarin  . Mental illness Sister   . Hypertension Sister   . Hypertension Brother     Social History Social History   Tobacco Use  . Smoking status: Former Smoker    Packs/day: 0.50    Years: 20.00    Pack years: 10.00    Types: Cigarettes    Last attempt to quit: 03/30/2014    Years since quitting: 3.3  . Smokeless tobacco: Never Used  Substance Use Topics  . Alcohol use: No  . Drug use: No     Allergies   Patient has no known allergies.   Review of Systems Review of Systems 10 Systems reviewed and are negative for acute change except as noted in the HPI.  Physical Exam Updated Vital Signs BP (!) 154/70 (BP Location: Left Arm)  Pulse (!) 59   Temp 97.9 F (36.6 C) (Oral)   Resp 18   SpO2 96%   Physical Exam  Constitutional: She is oriented to person, place, and time. She appears well-developed and well-nourished.  HENT:  Head: Normocephalic and atraumatic.  Bilateral TMs normal.  Minor external auditory canal trauma on the left.  Oral cavity normal.  Eyes: Pupils are equal, round, and reactive to light. EOM are normal.  Neck: Neck supple.  Cardiovascular: Normal rate, regular rhythm, normal heart sounds and intact distal pulses.  Pulmonary/Chest: Effort normal and breath sounds normal.  Abdominal: Soft. Bowel sounds are normal. She exhibits no distension. There is no tenderness.  Musculoskeletal: Normal range of motion. She exhibits no edema.  Neurological: She is alert and oriented to person, place, and time. She has  normal strength. No cranial nerve deficit. She exhibits normal muscle tone. Coordination normal. GCS eye subscore is 4. GCS verbal subscore is 5. GCS motor subscore is 6.  Speech and cognitive function normal.  Finger-nose exam normal.  No pronator drift.  Skin: Skin is warm, dry and intact.  Psychiatric: She has a normal mood and affect.     ED Treatments / Results  Labs (all labs ordered are listed, but only abnormal results are displayed) Labs Reviewed  BASIC METABOLIC PANEL - Abnormal; Notable for the following components:      Result Value   Glucose, Bld 120 (*)    All other components within normal limits  CBC WITH DIFFERENTIAL/PLATELET  PROTIME-INR  URINALYSIS, ROUTINE W REFLEX MICROSCOPIC  I-STAT TROPONIN, ED    EKG EKG Interpretation  Date/Time:  Thursday Jul 26 2017 11:12:35 EDT Ventricular Rate:  56 PR Interval:    QRS Duration: 96 QT Interval:  455 QTC Calculation: 440 R Axis:   13 Text Interpretation:  Sinus rhythm Low voltage, precordial leads otherwise normal, no old comparison Confirmed by Arby Barrette [id number]) on 07/26/2017 11:23:59 AM Also confirmed by Arby Barrette [phone]), editor Sheppard Evens ([id number])  on 07/26/2017 1:50:32 PM   Radiology Mr Maxine Glenn Head Wo Contrast  Result Date: 07/26/2017 CLINICAL DATA:  Ataxia, suspect stroke EXAM: MRI HEAD WITHOUT CONTRAST MRA HEAD WITHOUT CONTRAST MRA NECK WITHOUT AND WITH CONTRAST TECHNIQUE: Multiplanar, multiecho pulse sequences of the brain and surrounding structures were obtained without intravenous contrast. Angiographic images of the Circle of Willis were obtained using MRA technique without intravenous contrast. Angiographic images of the neck were obtained using MRA technique without and with intravenous contrast. Carotid stenosis measurements (when applicable) are obtained utilizing NASCET criteria, using the distal internal carotid diameter as the denominator. CONTRAST:  18mL MULTIHANCE GADOBENATE DIMEGLUMINE  529 MG/ML IV SOLN COMPARISON:  None. FINDINGS: MRI HEAD FINDINGS Negative for acute infarct. Mild chronic microvascular ischemic change in the [place] matter. Ventricle size normal. Cerebral volume normal. Negative for hemorrhage or mass. Normal arterial flow void Negative skeletal structures Mild mucosal edema paranasal sinuses.  Normal orbit. MRA HEAD FINDINGS Both vertebral arteries patent to the basilar. Basilar widely patent. Superior cerebellar and posterior cerebral arteries patent without stenosis. Internal carotid artery widely patent bilaterally. Anterior and middle cerebral arteries widely patent and normal. Negative for cerebral aneurysm. MRA NECK FINDINGS Antegrade flow in the carotid and vertebral arteries bilaterally. Carotid bifurcation widely no patent bilaterally without carotid stenosis. Both vertebral arteries widely patent without stenosis. IMPRESSION: 1. No acute intracranial abnormality. Mild chronic microvascular ischemic change in the [place] matter 2. Normal MRA head 3. Normal MRA neck Electronically Signed   By:  Marlan Palau M.D.   On: 07/26/2017 14:38   Mr Angiogram Neck W Or Wo Contrast  Result Date: 07/26/2017 CLINICAL DATA:  Ataxia, suspect stroke EXAM: MRI HEAD WITHOUT CONTRAST MRA HEAD WITHOUT CONTRAST MRA NECK WITHOUT AND WITH CONTRAST TECHNIQUE: Multiplanar, multiecho pulse sequences of the brain and surrounding structures were obtained without intravenous contrast. Angiographic images of the Circle of Willis were obtained using MRA technique without intravenous contrast. Angiographic images of the neck were obtained using MRA technique without and with intravenous contrast. Carotid stenosis measurements (when applicable) are obtained utilizing NASCET criteria, using the distal internal carotid diameter as the denominator. CONTRAST:  18mL MULTIHANCE GADOBENATE DIMEGLUMINE 529 MG/ML IV SOLN COMPARISON:  None. FINDINGS: MRI HEAD FINDINGS Negative for acute infarct. Mild chronic  microvascular ischemic change in the [place] matter. Ventricle size normal. Cerebral volume normal. Negative for hemorrhage or mass. Normal arterial flow void Negative skeletal structures Mild mucosal edema paranasal sinuses.  Normal orbit. MRA HEAD FINDINGS Both vertebral arteries patent to the basilar. Basilar widely patent. Superior cerebellar and posterior cerebral arteries patent without stenosis. Internal carotid artery widely patent bilaterally. Anterior and middle cerebral arteries widely patent and normal. Negative for cerebral aneurysm. MRA NECK FINDINGS Antegrade flow in the carotid and vertebral arteries bilaterally. Carotid bifurcation widely no patent bilaterally without carotid stenosis. Both vertebral arteries widely patent without stenosis. IMPRESSION: 1. No acute intracranial abnormality. Mild chronic microvascular ischemic change in the [place] matter 2. Normal MRA head 3. Normal MRA neck Electronically Signed   By: Marlan Palau M.D.   On: 07/26/2017 14:38   Mr Brain Wo Contrast (neuro Protocol)  Result Date: 07/26/2017 CLINICAL DATA:  Ataxia, suspect stroke EXAM: MRI HEAD WITHOUT CONTRAST MRA HEAD WITHOUT CONTRAST MRA NECK WITHOUT AND WITH CONTRAST TECHNIQUE: Multiplanar, multiecho pulse sequences of the brain and surrounding structures were obtained without intravenous contrast. Angiographic images of the Circle of Willis were obtained using MRA technique without intravenous contrast. Angiographic images of the neck were obtained using MRA technique without and with intravenous contrast. Carotid stenosis measurements (when applicable) are obtained utilizing NASCET criteria, using the distal internal carotid diameter as the denominator. CONTRAST:  18mL MULTIHANCE GADOBENATE DIMEGLUMINE 529 MG/ML IV SOLN COMPARISON:  None. FINDINGS: MRI HEAD FINDINGS Negative for acute infarct. Mild chronic microvascular ischemic change in the [place] matter. Ventricle size normal. Cerebral volume normal. Negative  for hemorrhage or mass. Normal arterial flow void Negative skeletal structures Mild mucosal edema paranasal sinuses.  Normal orbit. MRA HEAD FINDINGS Both vertebral arteries patent to the basilar. Basilar widely patent. Superior cerebellar and posterior cerebral arteries patent without stenosis. Internal carotid artery widely patent bilaterally. Anterior and middle cerebral arteries widely patent and normal. Negative for cerebral aneurysm. MRA NECK FINDINGS Antegrade flow in the carotid and vertebral arteries bilaterally. Carotid bifurcation widely no patent bilaterally without carotid stenosis. Both vertebral arteries widely patent without stenosis. IMPRESSION: 1. No acute intracranial abnormality. Mild chronic microvascular ischemic change in the [place] matter 2. Normal MRA head 3. Normal MRA neck Electronically Signed   By: Marlan Palau M.D.   On: 07/26/2017 14:38    Procedures Procedures (including critical care time) No critical care time Medications Ordered in ED Medications  LORazepam (ATIVAN) injection 1 mg (1 mg Intravenous Given 07/26/17 1337)  gadobenate dimeglumine (MULTIHANCE) injection 20 mL (18 mLs Intravenous Contrast Given 07/26/17 1430)     Initial Impression / Assessment and Plan / ED Course  I have reviewed the triage vital signs and the nursing  notes.  Pertinent labs & imaging results that were available during my care of the patient were reviewed by me and considered in my medical decision making (see chart for details).     Consult: Reviewed with Dr. Onalee Hua.  MRI has been obtained.  Dr. Amada Jupiter has concern for cerebellar TIA.  Recommends inpatient stroke work-up. Consult: Family practice for admission for TIA work-up. Final Clinical Impressions(s) / ED Diagnoses   Final diagnoses:  TIA (transient ischemic attack)  Ataxia  Patient had acute onset of ataxia.  She denied vertiginous symptoms.  She did not have other associated symptoms on review of  systems.  Neurology consult obtained.  Concern for possible cerebellar type TIA.  Plan for admission for full stroke evaluation.  ED Discharge Orders    None       Arby Barrette, MD 07/26/17 [phone]

## 2017-07-27 ENCOUNTER — Other Ambulatory Visit: Payer: Self-pay

## 2017-07-27 ENCOUNTER — Other Ambulatory Visit: Payer: Self-pay | Admitting: Family Medicine

## 2017-07-27 ENCOUNTER — Observation Stay (HOSPITAL_BASED_OUTPATIENT_CLINIC_OR_DEPARTMENT_OTHER): Payer: Medicare HMO

## 2017-07-27 DIAGNOSIS — G459 Transient cerebral ischemic attack, unspecified: Secondary | ICD-10-CM | POA: Diagnosis not present

## 2017-07-27 DIAGNOSIS — R270 Ataxia, unspecified: Secondary | ICD-10-CM | POA: Diagnosis not present

## 2017-07-27 LAB — URINALYSIS, ROUTINE W REFLEX MICROSCOPIC
Bilirubin Urine: NEGATIVE
Glucose, UA: NEGATIVE mg/dL
Hgb urine dipstick: NEGATIVE
Ketones, ur: NEGATIVE mg/dL
NITRITE: NEGATIVE
Protein, ur: NEGATIVE mg/dL
SPECIFIC GRAVITY, URINE: 1.016 (ref 1.005–1.030)
pH: 5 (ref 5.0–8.0)

## 2017-07-27 LAB — COMPREHENSIVE METABOLIC PANEL
ALK PHOS: 55 U/L (ref 38–126)
ALT: 32 U/L (ref 14–54)
ANION GAP: 6 (ref 5–15)
AST: 27 U/L (ref 15–41)
Albumin: 3.5 g/dL (ref 3.5–5.0)
BILIRUBIN TOTAL: 0.7 mg/dL (ref 0.3–1.2)
BUN: 12 mg/dL (ref 6–20)
CALCIUM: 8.5 mg/dL — AB (ref 8.9–10.3)
CO2: 23 mmol/L (ref 22–32)
CREATININE: 0.89 mg/dL (ref 0.44–1.00)
Chloride: 113 mmol/L — ABNORMAL HIGH (ref 101–111)
GFR calc non Af Amer: >60 mL/min (ref >60)
GLUCOSE: 100 mg/dL — AB (ref 65–99)
Potassium: 3.6 mmol/L (ref 3.5–5.1)
SODIUM: 142 mmol/L (ref 135–145)
TOTAL PROTEIN: 5.8 g/dL — AB (ref 6.5–8.1)

## 2017-07-27 LAB — HEMOGLOBIN A1C
Hgb A1c MFr Bld: 5.5 % (ref 4.8–5.6)
Mean Plasma Glucose: 111.15 mg/dL

## 2017-07-27 LAB — LIPID PANEL
CHOL/HDL RATIO: 3.8 ratio
Cholesterol: 148 mg/dL (ref 0–200)
HDL: 39 mg/dL — AB (ref >40)
LDL CALC: 70 mg/dL (ref 0–99)
TRIGLYCERIDES: 197 mg/dL — AB (ref <150)
VLDL: 39 mg/dL (ref 0–40)

## 2017-07-27 LAB — ECHOCARDIOGRAM COMPLETE
Height: 63 in
WEIGHTICAEL: 3019.42 [oz_av]

## 2017-07-27 MED ORDER — CLOPIDOGREL BISULFATE 75 MG PO TABS: 75.0000 mg | ORAL_TABLET | Freq: Every day | ORAL | 0 refills | Status: AC

## 2017-07-27 MED ORDER — ASPIRIN 81 MG PO CHEW
324.0000 mg | CHEWABLE_TABLET | Freq: Every day | ORAL | Status: DC
  Filled 2017-07-27: qty 4

## 2017-07-27 MED ORDER — ASPIRIN EC 81 MG PO TBEC: 81.0000 mg | DELAYED_RELEASE_TABLET | Freq: Every day | ORAL | 0 refills | Status: DC

## 2017-07-27 NOTE — Care Management Note (Signed)
Case Management Note  Patient Details  Name: Renee Luna MRN: [id number] Date of Birth: 11/13/1940  Subjective/Objective:     Pt in with TIA. She is from home with daughter.              Action/Plan: No f/u per PT/OT and no DME needs. Pt has PCP, insurance and transportation home.    Expected Discharge Date:  07/27/17               Expected Discharge Plan:  Home/Self Care  In-House Referral:     Discharge planning Services     Post Acute Care Choice:    Choice offered to:     DME Arranged:    DME Agency:     HH Arranged:    HH Agency:     Status of Service:  Completed, signed off  If discussed at [employer] of Stay Meetings, dates discussed:    Additional Comments:  Kermit Balo, RN 07/27/2017, 2:15 PM

## 2017-07-27 NOTE — Discharge Instructions (Signed)
You were admitted for a transient ischemic attack. The workup did not reveal any cause. You will need to take plavix 75mg  and aspirin 81mg  daily for the next three weeks. At that time you will stop the aspirin 81mg  and continue the plavix daily.  Regarding your blood pressure continue to take the 12.5mg  of toprol XL. Continue losartan 100mg . Please schedule an appointment for the middle of next week with your PCP for a blood pressure check. Please also follow up with neurology in 6 weeks.    Transient Ischemic Attack A transient ischemic attack (TIA) is a "warning stroke" that causes stroke-like symptoms. A TIA does not cause lasting damage to the brain. The symptoms of a TIA can happen fast and do not last long. It is important to know the symptoms of a TIA and what to do. This can help prevent stroke or death. Follow these instructions at home:  Take medicines only as told by your doctor. Make sure you understand all of the instructions.  You may need to take aspirin or warfarin medicine. Warfarin needs to be taken exactly as told. ? Taking too much or too little warfarin is dangerous. Blood tests must be done as often as told by your doctor. A PT blood test measures how long it takes for blood to clot. Your PT is used to calculate another value called an INR. Your PT and INR help your doctor adjust your warfarin dosage. He or she will make sure you are taking the right amount. ? Food can cause problems with warfarin and affect the results of your blood tests. This is true for foods high in vitamin K. Eat the same amount of foods high in vitamin K each day. Foods high in vitamin K include spinach, kale, broccoli, cabbage, collard and turnip greens, Brussels sprouts, peas, cauliflower, seaweed, and parsley. Other foods high in vitamin K include beef and pork liver, green tea, and soybean oil. Eat the same amount of foods high in vitamin K each day. Avoid big changes in your diet. Tell your doctor  before changing your diet. Talk to a food specialist (dietitian) if you have questions. ? Many medicines can cause problems with warfarin and affect your PT and INR. Tell your doctor about all medicines you take. This includes vitamins and dietary pills (supplements). Do not take or stop taking any prescribed or over-the-counter medicines unless your doctor tells you to. ? Warfarin can cause more bruising or bleeding. Hold pressure over any cuts for longer than normal. Talk to your doctor about other side effects of warfarin. ? Avoid sports or activities that may cause injury or bleeding. ? Be careful when you shave, floss, or use sharp objects. ? Avoid or drink very little alcohol while taking warfarin. Tell your doctor if you change how much alcohol you drink. ? Tell your dentist and other doctors that you take warfarin before any procedures.  Follow your diet program as told, if you are given one.  Keep a healthy weight.  Stay active. Try to get at least 30 minutes of activity on all or most days.  Do not use any tobacco products, including cigarettes, chewing tobacco, or electronic cigarettes. If you need help quitting, ask your doctor.  Limit alcohol intake to no more than 1 drink per day for nonpregnant women and 2 drinks per day for men. One drink equals 12 ounces of beer, 5 ounces of wine, or 1 ounces of hard liquor.  Do not abuse drugs.  Keep your home safe so you do not fall. You can do this by: ? Putting grab bars in the bedroom and bathroom. ? Raising toilet seats. ? Putting a seat in the shower.  Keep all follow-up visits as told by your doctor. This is important. Contact a doctor if:  Your personality changes.  You have trouble swallowing.  You have double vision.  You are dizzy.  You have a fever. Get help right away if: These symptoms may be an emergency. Do not wait to see if the symptoms will go away. Get medical help right away. Call your local emergency  services (911 in the [place] Do not drive yourself to the hospital.  You have sudden weakness or lose feeling (go numb), especially on one side of the body. This can affect your: ? Face. ? Arm. ? Leg.  You have sudden trouble walking.  You have sudden trouble moving your arms or legs.  You have sudden confusion.  You have trouble talking.  You have trouble understanding.  You have sudden trouble seeing in one or both eyes.  You lose your balance.  Your movements are not smooth.  You have a sudden, very bad headache with no known cause.  You have new chest pain.  Your heartbeat is unsteady.  You are partly or totally unaware of what is going on around you.  This information is not intended to replace advice given to you by your health care provider. Make sure you discuss any questions you have with your health care provider. Document Released: 11/23/2007 Document Revised: 10/18/2015 Document Reviewed: 05/21/2013 Elsevier Interactive Patient Education  [employer]2018 Elsevier Inc[employer]

## 2017-07-27 NOTE — Progress Notes (Signed)
  Echocardiogram 2D Echocardiogram has been performed.  Belva Chimes 07/27/2017, 10:35 AM

## 2017-07-27 NOTE — Progress Notes (Signed)
SLP Cancellation Note  Patient Details Name: Illa Levellice Swift MRN: [id number] DOB: 1940-10-20   Cancelled treatment:       Reason Eval/Treat Not Completed: SLP screened, no needs identified, will sign off   Blenda MountsCouture, Taylynn Easton Laurice 07/27/2017, 1:44 PM

## 2017-07-27 NOTE — Discharge Summary (Signed)
Family Medicine Teaching [facility] Discharge Summary  Patient name: Renee Luna Medical record number: [id number] Date of birth: 1940-03-16 Age: 77 y.o. Gender: female Date of Admission: 07/26/2017  Date of Discharge: 07/27/2017 Admitting Physician: Carney Living, MD  Primary Care Provider: Jarome Matin, MD Consultants: Neurology  Indication for Hospitalization: TIA workup  Discharge Diagnoses/Problem List:  Transient ischemic attack Gait instability COPD Hypertension Migraines GERD  Disposition: home  Discharge Condition: stable  Discharge Exam: General:AOx3, NAD. Caucasian female resting comfortably in bed Neck:range of motion intact Cardiovascular:regular rate rhythm, palpable pt/dp bilaterally, palpable radial pulse bilaterally, no murmurs/rubs/gallops Respiratory: Lungs clear to ausculation bilaterally, no increased work of breathing Gastrointestinal:soft, non-tender, non-distended. MSK:5/5 strength all muscle groups BUE, BLE. Derm:warm and dry Neuro:no neuro deficits, no gait abnormalities,  Psych:appropriate, pleasant  Brief Hospital Course:  77 year old female who presented on 5/30 due to weakness, gait instability, and dizziness. Presented as a possible stroke. Deficits had resolved prior to patient arriving in emergency department. Patient MRI/MRA which showed no acute infarct and no carotid stenosis. Underwent echocardiogram on 5/31 which showed normal EF and no concerning areas. Seen by PT/OT who did not recommend any home health options. Discussed with neurology who recommended continuing aspirin 81 and plavix  for 3 weeks. After three weeks the aspirin could be stopped.  Patient intermittently hypertensive while admitted. Patient only take half doses of her toprol xl and losartan. Recommended continuing half dose of toprol but taking normal  dose of losartan. She was advised to make a follow up appointment in 5 days to check bp. She  is to follow up with neurology in 6 weeks.  Issues for Follow Up:  1. Check blood pressure 2. Ensure gait abnormalities have not recurred  Significant Procedures:  EEG, echocardiogram  Significant Labs and Imaging:  Recent Labs  Lab 07/26/17 1228  WBC 8.9  HGB 12.5  HCT 38.5  PLT 256   Recent Labs  Lab 07/26/17 1228 07/27/17 0442  NA 143 142  K 3.6 3.6  CL 111 113*  CO2 24 23  GLUCOSE 120* 100*  BUN 10 12  CREATININE 0.82 0.89  CALCIUM 8.9 8.5*  ALKPHOS  --  55  AST  --  27  ALT  --  32  ALBUMIN  --  3.5    Results/Tests Pending at Time of Discharge:   Discharge Medications:  Allergies as of 07/27/2017   No Known Allergies     Medication List    TAKE these medications   aspirin EC 81 MG tablet Take 1 tablet (81 mg total) by mouth daily.   B-complex with vitamin C tablet Take 1 tablet by mouth daily.   calcium citrate-vitamin D 315-200 MG-UNIT tablet Commonly known as:  CITRACAL+D Take 1 tablet by mouth daily.   clopidogrel 75 MG tablet Commonly known as:  PLAVIX Take 1 tablet (75 mg total) by mouth daily.   Fish Oil 1200 MG Cpdr Take 1 capsule by mouth daily.   Flaxseed Oil 1200 MG Caps Take 1 capsule by mouth daily.   HAIR SKIN AND NAILS FORMULA PO Take 1 tablet by mouth daily.   losartan 100 MG tablet Commonly known as:  COZAAR Take 50 mg by mouth daily.   metoprolol succinate 25 MG 24 hr tablet Commonly known as:  TOPROL-XL Take 25 mg by mouth daily. Take with or immediately following a meal.   omeprazole 40 MG capsule Commonly known as:  PRILOSEC Take 40 mg by mouth daily.  SYMBICORT IN Inhale 2 (two) times daily into the lungs.   topiramate 25 MG tablet Commonly known as:  TOPAMAX Take 25 mg by mouth 2 (two) times daily as needed for headache.   VENTOLIN HFA 108 (90 Base) MCG/ACT inhaler Generic drug:  albuterol Take 1-2 puffs by mouth 4 (four) times daily as needed for shortness of breath.       Discharge  Instructions: Please refer to Patient Instructions section of EMR for full details.  Patient was counseled important signs and symptoms that should prompt return to medical care, changes in medications, dietary instructions, activity restrictions, and follow up appointments.   Follow-Up Appointments: Follow up in 5 days with pcp Follow up in 6 weeks with neurology  Myrene Buddy, MD 07/27/2017, 12:16 PM PGY-1, [facility]

## 2017-07-27 NOTE — Progress Notes (Signed)
STROKE TEAM PROGRESS NOTE   HISTORY OF PRESENT ILLNESS (per record) Trinidad Ingle is an 77 y.o. female with history of hypertension, COPD.  States that she was feeling fine this morning, went to work and around 0900 hrs. she suddenly and abruptly felt well pressure on the top of her head and as though her thinking was not as fast as usual.  Patient went to stand up and felt as though something posterior backwards.  She was able to lean forward and grab her desk.  She then started to try to walk and felt as though she was going side to side.  She describes as having "sea legs".  She did not feel as though she was weak in her legs she just could not keep her balance.  Patient denies any visual abnormalities, dysmetria with her arms, numbness or tingling, decreased strength, vertigo, and/or dizziness.  Patient has had viral encephalitis in the past and states she has had a chronic headache since then.  She admits to taking Tylenol or ibuprofen but only when it gets really bad.  Times lasted for approximately 20 minutes and then fully resolved     SUBJECTIVE (INTERVAL HISTORY) The patient's daughter was at the bedside. Dr. Pearlean Brownie had a long discussion regarding the patient's symptoms, which seem to have resolved. He recommended aspirin and Plavix 3 weeks then Plavix alone. He discussed the findings on her MRI/MRA both of which were essentially normal.   OBJECTIVE Temp:  [97.7 F (36.5 C)-98.2 F (36.8 C)] 98 F (36.7 C) (05/30 2352) Pulse Rate:  [59-70] 66 (05/31 0358) Cardiac Rhythm: Normal sinus rhythm (05/31 0700) Resp:  [15-18] 18 (05/31 0358) BP: (129-167)/(56-88) 129/60 (05/31 0358) SpO2:  [95 %-99 %] 95 % (05/31 0358) Weight:  [188 lb 11.4 oz (85.6 kg)] 188 lb 11.4 oz (85.6 kg) (05/30 2152)  CBC:  Recent Labs  Lab 07/26/17 1228  WBC 8.9  NEUTROABS 6.2  HGB 12.5  HCT 38.5  MCV 85.7  PLT 256    Basic Metabolic Panel:  Recent Labs  Lab 07/26/17 1228 07/27/17 0442  NA 143  142  K 3.6 3.6  CL 111 113*  CO2 24 23  GLUCOSE 120* 100*  BUN 10 12  CREATININE 0.82 0.89  CALCIUM 8.9 8.5*    Lipid Panel:     Component Value Date/Time   CHOL 148 07/27/2017 0442   TRIG 197 (H) 07/27/2017 0442   HDL 39 (L) 07/27/2017 0442   CHOLHDL 3.8 07/27/2017 0442   VLDL 39 07/27/2017 0442   LDLCALC 70 07/27/2017 0442   HgbA1c:  Lab Results  Component Value Date   HGBA1C 5.5 07/27/2017   Urine Drug Screen: No results found for: LABOPIA, COCAINSCRNUR, LABBENZ, AMPHETMU, THCU, LABBARB  Alcohol Level No results found for: [facility]  IMAGING   Mr Maxine Glenn Head Wo Contrast 07/26/2017 IMPRESSION:  1. No acute intracranial abnormality. Mild chronic microvascular ischemic change in the [place] matter  2. Normal MRA head  3. Normal MRA neck    Mr Angiogram Neck W Or Wo Contrast 07/26/2017 IMPRESSION:  1. No acute intracranial abnormality. Mild chronic microvascular ischemic change in the [place] matter  2. Normal MRA head  3. Normal MRA neck Electronically Signed      Mr Brain Wo Contrast (neuro Protocol) 07/26/2017 IMPRESSION:  1. No acute intracranial abnormality. Mild chronic microvascular ischemic change in the [place] matter  2. Normal MRA head  3. Normal MRA neck     Transthoracic Echocardiogram  07/27/2017  Study Conclusions  - Left ventricle: The cavity size was normal. Wall thickness was   normal. Systolic function was normal. The estimated ejection   fraction was in the range of 55% to 60%. Left ventricular   diastolic function parameters were normal. - Aortic valve: Valve area (VTI): 2.86 cm^2. Valve area (Vmax):   2.83 cm^2. Valve area (Vmean): 2.54 cm^2. - Left atrium: The atrium was mildly dilated.     PHYSICAL EXAM Vitals:   07/26/17 2030 07/26/17 2152 07/26/17 2352 07/27/17 0358  BP: 139/67 130/61 (!) 141/56 129/60  Pulse: 70 64 63 66  Resp: 17 18 18 18   Temp:  97.7 F (36.5 C) 98 F (36.7 C)   TempSrc:  Oral Oral   SpO2: 97% 97% 96% 95%   Weight:  188 lb 11.4 oz (85.6 kg)    Height:  5\' 3"  (1.6 m)     Pleasant elderly Caucasian lady not in distress. . Afebrile. Head is nontraumatic. Neck is supple without bruit.    Cardiac exam no murmur or gallop. Lungs are clear to auscultation. Distal pulses are well felt.  Neurological Exam ;  Awake  Alert oriented x 3. Normal speech and language.eye movements full without nystagmus.fundi were not visualized. Vision acuity and fields appear normal. Hearing is normal. Palatal movements are normal. Face symmetric. Tongue midline. Normal strength, tone, reflexes and coordination. Normal sensation. Gait deferred.     ASSESSMENT/PLAN Ms. Jaimie Pippins is a 77 y.o. female with history of COPD, hypertension, remote viral encephalitis and chronic headaches presenting with gait difficulties. She did not receive IV t-PA due to resolution of deficits.  Possible TIA:    Resultant  Resolution of deficits  CT head - not performed  MRI head - no acute intracranial abnormalities  MRA head and Neck - normal  Carotid Doppler - MRA neck  2D Echo - EF 50-55%. No cardiac source of emboli identified.  LDL - 70  HgbA1c - 5.5  VTE prophylaxis - Lovenox Diet Order           Diet Heart Room service appropriate? Yes; Fluid consistency: Thin  Diet effective now           No antithrombotic prior to admission, now on aspirin 81 mg daily  Patient counseled to be compliant with her antithrombotic medications  Ongoing aggressive stroke risk factor management  Therapy recommendations: no follow-up therapies recommended  Disposition:  Discharged  Hypertension  Stable . Permissive hypertension (OK if < 220/120) but gradually normalize in 5-7 days . Long-term BP goal normotensive  Hyperlipidemia  Lipid lowering medication PTA:  None  LDL 70, goal < 70  Current lipid lowering medication: Lipitor 40 mg daily  Continue statin at discharge   Other Stroke Risk Factors  Advanced  age  Former cigarette smoker - quit 3 years ago  Obesity, Body mass index is 33.43 kg/m., recommend weight loss, diet and exercise as appropriate    Other Active Problems  COPD   Plan / Recommendations   Stroke workup: awaiting - workup complete  Therapy Follow Up: no follow-up therapies recommended  Disposition: he patient has been discharged  Antiplatelet / Anticoagulation: aspirin 81 mg daily and Plavix 75 mg daily for 3 weeks. Then Plavix alone.  Statin: Lipitor 40 mg daily  MD Follow Up: [facility] in 6-8 weeks  Further risk factor modification per primary care MD: Follow Up 2 weeks   Hospital day # 0  Delton See PA-C [facility] Pager [phone] 07/27/2017, 2:57 PM I have personally examined this patient, reviewed notes, independently viewed imaging studies, participated in medical decision making and plan of care.ROS completed by me personally and pertinent positives fully documented  I have made any additions or clarifications directly to the above note. Agree with note above.she presented with transient dizziness and gait ataxia possibly a TIA due to small vessel disease. Recommend dual antiplatelet therapy for3 weeks followed by aspirin alone. Patient counseled to eat a healthy diet and lose weight. She will follow-up in the stroke clinic in 6 weeks or call earlier if necessary. Greater than 50% time during this 25 minute visit was spent on counseling and coordination of care about her TIA and stroke prevention discussion. Delia HeadyPramod Deunta Beneke, MD Medical Director [facility] Pager: [phone] 07/27/2017 4:48 PM   To contact Stroke Continuity provider, please refer to [email or web address]. After hours, contact General Neurology

## 2017-07-27 NOTE — Progress Notes (Signed)
Pt 77 year old [place] female admitted from 5 C with dx TIA . Pt awake  alert x 4   move all extremities with no weakness. Oriented to unit and cardiac monitor in use SR. Pt denied any c/o at present . Will continue to monitor.

## 2017-07-27 NOTE — Evaluation (Addendum)
Occupational Therapy Evaluation Patient Details Name: Renee Luna MRN: [id number] DOB: 1940-11-21 Today's Date: 07/27/2017    History of Present Illness Pt. is a 77 y.o. F with significant PMH of COPD, hypertension, migraines, who presents with leg weakness, trouble standing, and issues with dizziness. Admitted for TIA work up.   Clinical Impression   This 77 y/o female presents with the above. At baseline pt is independent with ADLs, iADLs and functional mobility, was working. Pt completing functional mobility without AD, standing grooming ADLs and demonstrating ability to complete LB ADLs with supervision throughout and with no overt LOB; completed tub transfers with minguard assist. Pt reports feeling she is back at her baseline regarding ADL and mobility completion. Feel pt is safe to return home from OT standpoint once medically ready. Do not anticipate pt will require follow up OT needs. OT referral is appreciated, acute OT to sign off.     Follow Up Recommendations  No OT follow up;Supervision - Intermittent    Equipment Recommendations  None recommended by OT           Precautions / Restrictions Precautions Precautions: None Restrictions Weight Bearing Restrictions: No      Mobility Bed Mobility Overal bed mobility: Independent                Transfers Overall transfer level: Independent Equipment used: None                  Balance Overall balance assessment: No apparent balance deficits (not formally assessed)                               Standardized Balance Assessment Standardized Balance Assessment : Dynamic Gait Index   Dynamic Gait Index Level Surface: Normal Change in Gait Speed: Normal Gait with Horizontal Head Turns: Normal Gait with Vertical Head Turns: Normal Gait and Pivot Turn: Normal Step Over Obstacle: Mild Impairment Step Around Obstacles: Mild Impairment Steps: Mild Impairment Total Score: 21     ADL either  performed or assessed with clinical judgement   ADL Overall ADL's : At baseline                                       General ADL Comments: Pt demonstrating LB and standing grooming ADLs, functional mobility without AD and supervision throughout; demonstrates tub transfer with minguard assist. Pt reports feeling at her baseline regarding ADLs and mobility; completed moderately higher level balance challenges with no overt LOB noted      Vision Baseline Vision/History: No visual deficits Patient Visual Report: No change from baseline Vision Assessment?: No apparent visual deficits;Yes Eye Alignment: Within Functional Limits Ocular Range of Motion: Within Functional Limits Alignment/Gaze Preference: Within Defined Limits Tracking/Visual Pursuits: Able to track stimulus in all quads without difficulty                Pertinent Vitals/Pain Pain Assessment: No/denies pain     Hand Dominance Right   Extremity/Trunk Assessment Upper Extremity Assessment Upper Extremity Assessment: Overall WFL for tasks assessed   Lower Extremity Assessment Lower Extremity Assessment: Defer to PT evaluation RLE Deficits / Details: MMT: 5/5 hip flexion, knee extension, ankle dorsiflexion/plantarflexion LLE Deficits / Details: 5/5 except 4/5 hip flexion (chronic hip pain at baseline)   Cervical / Trunk Assessment Cervical / Trunk Assessment: Kyphotic   Communication Communication Communication:  No difficulties   Cognition Arousal/Alertness: Awake/alert Behavior During Therapy: WFL for tasks assessed/performed Overall Cognitive Status: Within Functional Limits for tasks assessed                                     General Comments  Reviewed stroke symptoms               Home Living Family/patient expects to be discharged to:: Private residence Living Arrangements: Children(daughter ) Available Help at Discharge: Family Type of Home: Apartment Home  Access: Level entry     Home Layout: One level     Bathroom Shower/Tub: Chief Strategy Officer: Standard     Home Equipment: None          Prior Functioning/Environment Level of Independence: Independent        Comments: Works as a Engineer, materials, but it's mainly a desk job        OT Problem List: Decreased activity tolerance      OT Treatment/Interventions:      OT Goals(Current goals can be found in the care plan section) Acute Rehab OT Goals Patient Stated Goal: return to work OT Goal Formulation: All assessment and education complete, DC therapy                                 AM-PAC PT "6 Clicks" Daily Activity     Outcome Measure Help from another person eating meals?: None Help from another person taking care of personal grooming?: None Help from another person toileting, which includes using toliet, bedpan, or urinal?: None Help from another person bathing (including washing, rinsing, drying)?: None Help from another person to put on and taking off regular upper body clothing?: None Help from another person to put on and taking off regular lower body clothing?: None 6 Click Score: 24   End of Session Equipment Utilized During Treatment: Gait belt Nurse Communication: Mobility status  Activity Tolerance: Patient tolerated treatment well Patient left: in bed;with call bell/phone within reach;with family/visitor present  OT Visit Diagnosis: Other symptoms and signs involving the nervous system (R29.898)                Time: 1610-9604 OT Time Calculation (min): 15 min Charges:  OT General Charges $OT Visit: 1 Visit OT Evaluation $OT Eval Low Complexity: 1 Low G-Codes:     Marcy Siren, OT Pager [phone] 07/27/2017   Orlando Penner 07/27/2017, 10:26 AM

## 2017-07-27 NOTE — Evaluation (Addendum)
Physical Therapy Evaluation Patient Details Name: Renee Luna MRN: [id number] DOB: [birth year] Today's Date: 07/27/2017   History of Present Illness  Pt. is a 77 y.o. F with significant PMH of COPD, hypertension, migraines, who presents with leg weakness, trouble standing, and issues with dizziness. Admitted for TIA work up.  Clinical Impression  Patient evaluated by Physical Therapy with no further acute PT needs identified. All education has been completed and the patient has no further questions. Patient reports all symptoms have resolved. Patient scoring a 21/24 on Dynamic Gait Index, indicating she is not at risk for falls.  No follow-up Physical Therapy or equipment needs. PT is signing off. Thank you for this referral.     Follow Up Recommendations No PT follow up    Equipment Recommendations  None recommended by PT    Recommendations for Other Services       Precautions / Restrictions Precautions Precautions: None Restrictions Weight Bearing Restrictions: No      Mobility  Bed Mobility Overal bed mobility: Independent                Transfers Overall transfer level: Independent                  Ambulation/Gait Ambulation/Gait assistance: Independent Ambulation Distance (Feet): 400 Feet Assistive device: None Gait Pattern/deviations: WFL(Within Functional Limits)        Stairs Stairs: Yes Stairs assistance: Independent Stair Management: One rail Left Number of Stairs: 12 General stair comments: Step over step with use of rail when descending  Wheelchair Mobility    Modified Rankin (Stroke Patients Only) Pre-Morbid Rankin Score: No symptoms Modified Rankin: No symptoms         Balance                                 Standardized Balance Assessment Standardized Balance Assessment : Dynamic Gait Index   Dynamic Gait Index Level Surface: Normal Change in Gait Speed: Normal Gait with Horizontal Head Turns:  Normal Gait with Vertical Head Turns: Normal Gait and Pivot Turn: Normal Step Over Obstacle: Mild Impairment Step Around Obstacles: Mild Impairment Steps: Mild Impairment Total Score: 21       Pertinent Vitals/Pain Pain Assessment: No/denies pain    Home Living Family/patient expects to be discharged to:: Private residence Living Arrangements: Children(daughter) Available Help at Discharge: Family Type of Home: Apartment Home Access: Level entry     Home Layout: One level Home Equipment: None      Prior Function Level of Independence: Independent         Comments: Works as a Engineer, materials, 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   Dominant Hand: Right    Extremity/Trunk Assessment   Upper Extremity Assessment Upper Extremity Assessment: Defer to OT evaluation    Lower Extremity Assessment Lower Extremity Assessment: RLE deficits/detail;LLE deficits/detail RLE Deficits / Details: MMT: 5/5 hip flexion, knee extension, ankle dorsiflexion/plantarflexion LLE Deficits / Details: 5/5 except 4/5 hip flexion (chronic hip pain at baseline)    Cervical / Trunk Assessment Cervical / Trunk Assessment: Kyphotic  Communication   Communication: No difficulties  Cognition Arousal/Alertness: Awake/alert Behavior During Therapy: WFL for tasks assessed/performed Overall Cognitive Status: Within Functional Limits for tasks assessed  General Comments General comments (skin integrity, edema, etc.): Reviewed stroke symptoms. HR 116, 96% SpO2 on RA during mobility.    Exercises     Assessment/Plan    PT Assessment Patent does not need any further PT services  PT Problem List         PT Treatment Interventions      PT Goals (Current goals can be found in the Care Plan section)  Acute Rehab PT Goals Patient Stated Goal: return to work PT Goal Formulation: All assessment and education complete, DC  therapy    Frequency     Barriers to discharge        Co-evaluation               AM-PAC PT "6 Clicks" Daily Activity  Outcome Measure Difficulty turning over in bed (including adjusting bedclothes, sheets and blankets)?: None Difficulty moving from lying on back to sitting on the side of the bed? : None Difficulty sitting down on and standing up from a chair with arms (e.g., wheelchair, bedside commode, etc,.)?: None Help needed moving to and from a bed to chair (including a wheelchair)?: None Help needed walking in hospital room?: None Help needed climbing 3-5 steps with a railing? : None 6 Click Score: 24    End of Session Equipment Utilized During Treatment: Gait belt Activity Tolerance: Patient tolerated treatment well Patient left: in bed;with family/visitor present;with call bell/phone within reach Nurse Communication: Mobility status PT Visit Diagnosis: Unsteadiness on feet (R26.81);Difficulty in walking, not elsewhere classified (R26.2)    Time: 8469-6295 PT Time Calculation (min) (ACUTE ONLY): 23 min   Charges:   PT Evaluation $PT Eval Low Complexity: 1 Low PT Treatments $Therapeutic Activity: 8-22 mins   PT G Codes:       Laurina Bustle, PT, DPT Acute Rehabilitation Services  Pager: [phone]   Vanetta Mulders 07/27/2017, 9:35 AM

## 2017-08-09 DIAGNOSIS — G458 Other transient cerebral ischemic attacks and related syndromes: Secondary | ICD-10-CM | POA: Diagnosis not present

## 2017-08-09 DIAGNOSIS — Z6832 Body mass index (BMI) 32.0-32.9, adult: Secondary | ICD-10-CM | POA: Diagnosis not present

## 2017-08-09 DIAGNOSIS — J449 Chronic obstructive pulmonary disease, unspecified: Secondary | ICD-10-CM | POA: Diagnosis not present

## 2017-08-09 DIAGNOSIS — I10 Essential (primary) hypertension: Secondary | ICD-10-CM | POA: Diagnosis not present

## 2017-08-09 DIAGNOSIS — G43009 Migraine without aura, not intractable, without status migrainosus: Secondary | ICD-10-CM | POA: Diagnosis not present

## 2017-08-09 DIAGNOSIS — Z1389 Encounter for screening for other disorder: Secondary | ICD-10-CM | POA: Diagnosis not present

## 2017-08-23 ENCOUNTER — Other Ambulatory Visit: Payer: Self-pay | Admitting: Family Medicine

## 2017-08-24 ENCOUNTER — Encounter: Payer: Self-pay | Admitting: Pulmonary Disease

## 2017-08-24 ENCOUNTER — Ambulatory Visit (INDEPENDENT_AMBULATORY_CARE_PROVIDER_SITE_OTHER): Payer: Medicare HMO | Admitting: Pulmonary Disease

## 2017-08-24 ENCOUNTER — Ambulatory Visit: Payer: Medicare HMO | Admitting: Pulmonary Disease

## 2017-08-24 VITALS — BP 140/78 | HR 72 | Ht 63.0 in | Wt 182.0 lb

## 2017-08-24 DIAGNOSIS — R059 Cough, unspecified: Secondary | ICD-10-CM

## 2017-08-24 DIAGNOSIS — J449 Chronic obstructive pulmonary disease, unspecified: Secondary | ICD-10-CM | POA: Diagnosis not present

## 2017-08-24 DIAGNOSIS — R0600 Dyspnea, unspecified: Secondary | ICD-10-CM | POA: Diagnosis not present

## 2017-08-24 DIAGNOSIS — R05 Cough: Secondary | ICD-10-CM

## 2017-08-24 LAB — PULMONARY FUNCTION TEST
DL/VA % pred: 92 %
DL/VA: 4.32 ml/min/mmHg/L
DLCO unc % pred: 81 %
DLCO unc: 18.73 ml/min/mmHg
FEF 25-75 POST: 3.05 L/s
FEF 25-75 Pre: 2.86 L/sec
FEF2575-%Change-Post: 6 %
FEF2575-%Pred-Post: 188 %
FEF2575-%Pred-Pre: 176 %
FEV1-%Change-Post: 1 %
FEV1-%PRED-PRE: 104 %
FEV1-%Pred-Post: 106 %
FEV1-POST: 2.16 L
FEV1-Pre: 2.13 L
FEV1FVC-%Change-Post: 5 %
FEV1FVC-%Pred-Pre: 112 %
FEV6-%CHANGE-POST: -5 %
FEV6-%PRED-PRE: 97 %
FEV6-%Pred-Post: 93 %
FEV6-Post: 2.39 L
FEV6-Pre: 2.52 L
FEV6FVC-%PRED-POST: 105 %
FEV6FVC-%Pred-Pre: 105 %
FVC-%CHANGE-POST: -4 %
FVC-%PRED-POST: 89 %
FVC-%Pred-Pre: 93 %
FVC-Post: 2.41 L
FVC-Pre: 2.52 L
POST FEV6/FVC RATIO: 100 %
PRE FEV1/FVC RATIO: 85 %
Post FEV1/FVC ratio: 90 %
Pre FEV6/FVC Ratio: 100 %
RV % pred: 98 %
RV: 2.19 L
TLC % PRED: 97 %
TLC: 4.77 L

## 2017-08-24 MED ORDER — OMEPRAZOLE 40 MG PO CPDR: 40.0000 mg | DELAYED_RELEASE_CAPSULE | Freq: Two times a day (BID) | ORAL | 2 refills | Status: DC

## 2017-08-24 NOTE — Progress Notes (Signed)
PFT completed today. 08/24/17  

## 2017-08-24 NOTE — Patient Instructions (Signed)
Your PFTs are normal with no evidence of COPD Let us try to stop the Symbicort and see how you do Continue the albuterol as needed Increase Prilosec to 40 mg twice daily.  Follow-up in 6 months.

## 2017-08-24 NOTE — Progress Notes (Signed)
Renee Luna    [id number]    May 18, 1940  Primary Care Physician:Paterson, Reuel Boomaniel, MD  Referring Physician: Jarome MatinPaterson, Daniel, MD [address] [place], [place] [address]  Chief complaint: Consult for COPD  HPI: 77 year old with history of hypertension, allergies.  Diagnosed with COPD in 2018.  She has been maintained on Symbicort which helps with her symptoms.  She tried to stop the steroid inhaler and try anoro but had worsening dyspnea and had to go back on Symbicort  Pets: 2 cats.  No birds, farm animals Occupation: Works as a Engineer, materialssecurity officer for a [employer] Exposures: No known exposures, no mold, hot tub Smoking history: 5 pack-year smoking history.  Quit in 2018 Travel History: Grew up in [place].  [place]oved [place].  No significant travel.  Interim history: [name] is here for review of PFTs.  Symptoms are stable on Symbicort Complains of mostly dyspnea on exertion.  Denies dyspnea at rest.  Has nonproductive cough with occasional wheeze.  No mucus production, fevers, chills Complains of hoarseness of voice.  Outpatient Encounter Medications as of 08/24/2017  Medication Sig  . B Complex-C (B-COMPLEX WITH VITAMIN C) tablet Take 1 tablet by mouth daily.  . Budesonide-Formoterol Fumarate (SYMBICORT IN) Inhale 2 (two) times daily into the lungs.  . calcium citrate-vitamin D (CITRACAL+D) 315-200 MG-UNIT tablet Take 1 tablet by mouth daily.  . clopidogrel (PLAVIX) 75 MG tablet Take 1 tablet (75 mg total) by mouth daily.  . Flaxseed, Linseed, (FLAXSEED OIL) 1200 MG CAPS Take 1 capsule by mouth daily.  Marland Kitchen. losartan (COZAAR) 100 MG tablet Take 50 mg by mouth daily.   . metoprolol succinate (TOPROL-XL) 25 MG 24 hr tablet Take 25 mg by mouth daily. Take with or immediately following a meal.   . Multiple Vitamins-Minerals (HAIR SKIN AND NAILS FORMULA PO) Take 1 tablet by mouth daily.  . Omega-3 Fatty Acids (FISH OIL) 1200 MG CPDR Take 1 capsule by  mouth daily.  Marland Kitchen. omeprazole (PRILOSEC) 40 MG capsule Take 40 mg by mouth daily.  Marland Kitchen. topiramate (TOPAMAX) 25 MG tablet Take 25 mg by mouth 2 (two) times daily as needed for headache.  . VENTOLIN HFA 108 (90 Base) MCG/ACT inhaler Take 1-2 puffs by mouth 4 (four) times daily as needed for shortness of breath.  . [DISCONTINUED] aspirin EC 81 MG tablet Take 1 tablet (81 mg total) by mouth daily.   No facility-administered encounter medications on file as of 08/24/2017.     Allergies as of 08/24/2017  . (No Known Allergies)    Past Medical History:  Diagnosis Date  . COPD (chronic obstructive pulmonary disease) (HCC)   . GERD (gastroesophageal reflux disease)   . Hypertension   . IBS (irritable bowel syndrome)     Past Surgical History:  Procedure Laterality Date  . OOPHORECTOMY      Family History  Problem Relation Age of Onset  . Hyperlipidemia Mother   . Hypertension Father   . Diabetes Sister   . Hypertension Sister   . Cancer Sister        ovarin  . Cancer Sister        ovarin  . Mental illness Sister   . Hypertension Sister   . Hypertension Brother     Social History   Socioeconomic History  . Marital status: Divorced    Spouse name: Not on file  . Number of children: Not on file  . Years of education:  Not on file  . Highest education level: Not on file  Occupational History  . Not on file  Social Needs  . Financial resource strain: Not on file  . Food insecurity:    Worry: Not on file    Inability: Not on file  . Transportation needs:    Medical: Not on file    Non-medical: Not on file  Tobacco Use  . Smoking status: Former Smoker    Packs/day: 0.50    Years: 20.00    Pack years: 10.00    Types: Cigarettes    Last attempt to quit: 03/30/2014    Years since quitting: 3.4  . Smokeless tobacco: Never Used  Substance and Sexual Activity  . Alcohol use: No  . Drug use: No  . Sexual activity: Not on file  Lifestyle  . Physical activity:    Days per  week: Not on file    Minutes per session: Not on file  . Stress: Not on file  Relationships  . Social connections:    Talks on phone: Not on file    Gets together: Not on file    Attends religious service: Not on file    Active member of club or organization: Not on file    Attends meetings of clubs or organizations: Not on file    Relationship status: Not on file  . Intimate partner violence:    Fear of current or ex partner: Not on file    Emotionally abused: Not on file    Physically abused: Not on file    Forced sexual activity: Not on file  Other Topics Concern  . Not on file  Social History Narrative  . Not on file    Review of systems: Review of Systems  Constitutional: Negative for fever and chills.  HENT: Negative.   Eyes: Negative for blurred vision.  Respiratory: as per HPI  Cardiovascular: Negative for chest pain and palpitations.  Gastrointestinal: Negative for vomiting, diarrhea, blood per rectum. Genitourinary: Negative for dysuria, urgency, frequency and hematuria.  Musculoskeletal: Negative for myalgias, back pain and joint pain.  Skin: Negative for itching and rash.  Neurological: Negative for dizziness, tremors, focal weakness, seizures and loss of consciousness.  Endo/Heme/Allergies: Negative for environmental allergies.  Psychiatric/Behavioral: Negative for depression, suicidal ideas and hallucinations.  All other systems reviewed and are negative.  Physical Exam: Blood pressure 140/78, pulse 72, height 5\' 3"  (1.6 m), weight 182 lb (82.6 kg), SpO2 95 %. Gen:      No acute distress HEENT:  EOMI, sclera anicteric Neck:     No masses; no thyromegaly Lungs:    Clear to auscultation bilaterally; normal respiratory effort CV:         Regular rate and rhythm; no murmurs Abd:      + bowel sounds; soft, non-tender; no palpable masses, no distension Ext:    No edema; adequate peripheral perfusion Skin:      Warm and dry; no rash Neuro: alert and oriented x  3 Psych: normal mood and affect  Data Reviewed: Labs CBC 01/12/17-WBC 8.8, eos 0.5%, absolute eosinophil count 100 CBC 07/26/17-WBC 8.9, eos 1%, absolute eosinophil count 89  Blood allergy profile 05/16/2017-IgE 411, RAST panel negative.   PFTs 03/01/16 FVC 2.38 [86%], FEV1 2.03 [98%], F/F 85, TLC 91%, DLCO 72% Minimal diffusion defect.  PFTs 08/16/2017 FVC 2.41 [89%], FEV1 2.16 [106%], F/F 90, TLC 97%, DLCO 81% Normal study.  FENO 05/24/17-24  Imaging Chest x-ray 01/12/17- no active cardiopulmonary disease,  scoliosis and kyphosis of thoracolumbar spine.  I have reviewed the images personally.  Assessment:  Consult for COPD No evidence of COPD.  Reviewed PFTs with no obstruction.  Reduction in ERV indicates effect of body habitus  Currently on Symbicort.  Will stop the inhaler and see how she does Continue albuterol as needed Discussed weight loss with diet and exercise.  Hoarseness Suspect acid reflux may be playing a role in her symptoms.  No evidence of thrush on examination Increase Prilosec to 40 mg twice daily for 2 weeks. If symptoms continue in spite of stopping Symbicort and increase in PPI then consider ENT evaluation.  Plan/Recommendations: - Stop symbicort, continue albuterol as needed - Weight loss - Increase Protonix to 40 twice daily for 2 weeks.  Chilton Greathouse MD [facility] 08/24/2017, 11:43 AM  CC: Jarome Matin, MD

## 2017-09-14 DIAGNOSIS — Z6833 Body mass index (BMI) 33.0-33.9, adult: Secondary | ICD-10-CM | POA: Diagnosis not present

## 2017-09-14 DIAGNOSIS — R0602 Shortness of breath: Secondary | ICD-10-CM | POA: Diagnosis not present

## 2017-09-14 DIAGNOSIS — M4120 Other idiopathic scoliosis, site unspecified: Secondary | ICD-10-CM | POA: Diagnosis not present

## 2017-09-14 DIAGNOSIS — K219 Gastro-esophageal reflux disease without esophagitis: Secondary | ICD-10-CM | POA: Diagnosis not present

## 2017-09-14 DIAGNOSIS — I10 Essential (primary) hypertension: Secondary | ICD-10-CM | POA: Diagnosis not present

## 2017-09-14 DIAGNOSIS — J449 Chronic obstructive pulmonary disease, unspecified: Secondary | ICD-10-CM | POA: Diagnosis not present

## 2017-10-16 DIAGNOSIS — J449 Chronic obstructive pulmonary disease, unspecified: Secondary | ICD-10-CM | POA: Diagnosis not present

## 2017-10-16 DIAGNOSIS — M25552 Pain in left hip: Secondary | ICD-10-CM | POA: Diagnosis not present

## 2017-10-16 DIAGNOSIS — Z6832 Body mass index (BMI) 32.0-32.9, adult: Secondary | ICD-10-CM | POA: Diagnosis not present

## 2017-10-16 DIAGNOSIS — I10 Essential (primary) hypertension: Secondary | ICD-10-CM | POA: Diagnosis not present

## 2017-10-16 DIAGNOSIS — R600 Localized edema: Secondary | ICD-10-CM | POA: Diagnosis not present

## 2017-11-20 ENCOUNTER — Other Ambulatory Visit: Payer: Self-pay | Admitting: Pulmonary Disease

## 2017-11-20 DIAGNOSIS — H2513 Age-related nuclear cataract, bilateral: Secondary | ICD-10-CM | POA: Diagnosis not present

## 2017-11-20 DIAGNOSIS — H25013 Cortical age-related cataract, bilateral: Secondary | ICD-10-CM | POA: Diagnosis not present

## 2017-11-20 DIAGNOSIS — H40001 Preglaucoma, unspecified, right eye: Secondary | ICD-10-CM | POA: Diagnosis not present

## 2017-11-20 DIAGNOSIS — H16223 Keratoconjunctivitis sicca, not specified as Sjogren's, bilateral: Secondary | ICD-10-CM | POA: Diagnosis not present

## 2017-12-13 DIAGNOSIS — E7849 Other hyperlipidemia: Secondary | ICD-10-CM | POA: Diagnosis not present

## 2017-12-13 DIAGNOSIS — M859 Disorder of bone density and structure, unspecified: Secondary | ICD-10-CM | POA: Diagnosis not present

## 2017-12-13 DIAGNOSIS — I10 Essential (primary) hypertension: Secondary | ICD-10-CM | POA: Diagnosis not present

## 2017-12-13 DIAGNOSIS — R82998 Other abnormal findings in urine: Secondary | ICD-10-CM | POA: Diagnosis not present

## 2017-12-21 DIAGNOSIS — M4120 Other idiopathic scoliosis, site unspecified: Secondary | ICD-10-CM | POA: Diagnosis not present

## 2017-12-21 DIAGNOSIS — Z1212 Encounter for screening for malignant neoplasm of rectum: Secondary | ICD-10-CM | POA: Diagnosis not present

## 2017-12-21 DIAGNOSIS — E668 Other obesity: Secondary | ICD-10-CM | POA: Diagnosis not present

## 2017-12-21 DIAGNOSIS — Z0000 Encounter for general adult medical examination without abnormal findings: Secondary | ICD-10-CM | POA: Diagnosis not present

## 2017-12-21 DIAGNOSIS — M859 Disorder of bone density and structure, unspecified: Secondary | ICD-10-CM | POA: Diagnosis not present

## 2017-12-21 DIAGNOSIS — R600 Localized edema: Secondary | ICD-10-CM | POA: Diagnosis not present

## 2017-12-21 DIAGNOSIS — M545 Low back pain: Secondary | ICD-10-CM | POA: Diagnosis not present

## 2017-12-21 DIAGNOSIS — Z23 Encounter for immunization: Secondary | ICD-10-CM | POA: Diagnosis not present

## 2017-12-21 DIAGNOSIS — J449 Chronic obstructive pulmonary disease, unspecified: Secondary | ICD-10-CM | POA: Diagnosis not present

## 2017-12-21 DIAGNOSIS — I10 Essential (primary) hypertension: Secondary | ICD-10-CM | POA: Diagnosis not present

## 2017-12-25 ENCOUNTER — Other Ambulatory Visit: Payer: Self-pay | Admitting: Internal Medicine

## 2017-12-25 DIAGNOSIS — M4120 Other idiopathic scoliosis, site unspecified: Secondary | ICD-10-CM

## 2018-01-23 ENCOUNTER — Other Ambulatory Visit: Payer: Medicare HMO

## 2018-02-01 ENCOUNTER — Ambulatory Visit: Payer: Medicare HMO | Admitting: Pulmonary Disease

## 2018-02-22 ENCOUNTER — Other Ambulatory Visit: Payer: Self-pay | Admitting: Pulmonary Disease

## 2018-02-25 ENCOUNTER — Other Ambulatory Visit: Payer: Self-pay | Admitting: Internal Medicine

## 2018-02-25 DIAGNOSIS — Z1231 Encounter for screening mammogram for malignant neoplasm of breast: Secondary | ICD-10-CM

## 2018-02-28 ENCOUNTER — Ambulatory Visit: Payer: Medicare HMO | Admitting: Nurse Practitioner

## 2018-02-28 DIAGNOSIS — R0602 Shortness of breath: Secondary | ICD-10-CM | POA: Diagnosis not present

## 2018-02-28 DIAGNOSIS — J449 Chronic obstructive pulmonary disease, unspecified: Secondary | ICD-10-CM | POA: Diagnosis not present

## 2018-02-28 DIAGNOSIS — Z6833 Body mass index (BMI) 33.0-33.9, adult: Secondary | ICD-10-CM | POA: Diagnosis not present

## 2018-02-28 DIAGNOSIS — M5489 Other dorsalgia: Secondary | ICD-10-CM | POA: Diagnosis not present

## 2018-06-21 DIAGNOSIS — I10 Essential (primary) hypertension: Secondary | ICD-10-CM | POA: Diagnosis not present

## 2018-06-21 DIAGNOSIS — M545 Low back pain: Secondary | ICD-10-CM | POA: Diagnosis not present

## 2018-06-21 DIAGNOSIS — J449 Chronic obstructive pulmonary disease, unspecified: Secondary | ICD-10-CM | POA: Diagnosis not present

## 2018-06-21 DIAGNOSIS — K219 Gastro-esophageal reflux disease without esophagitis: Secondary | ICD-10-CM | POA: Diagnosis not present

## 2018-08-07 DIAGNOSIS — L237 Allergic contact dermatitis due to plants, except food: Secondary | ICD-10-CM | POA: Diagnosis not present

## 2018-09-08 ENCOUNTER — Other Ambulatory Visit: Payer: Self-pay | Admitting: Pulmonary Disease

## 2018-12-13 ENCOUNTER — Encounter: Payer: Self-pay | Admitting: Gastroenterology

## 2019-01-06 ENCOUNTER — Encounter: Payer: Self-pay | Admitting: Gastroenterology

## 2019-09-07 IMAGING — DX DG CHEST 2V
2 series · 2 of 2 positions shown · non-contrast
Comparison: None.

CLINICAL DATA: Essential hypertension Chronic obstructive pulmonary
disease, unspecified COPD type (HCC)

EXAM:
CHEST  2 VIEW

[chest pa]
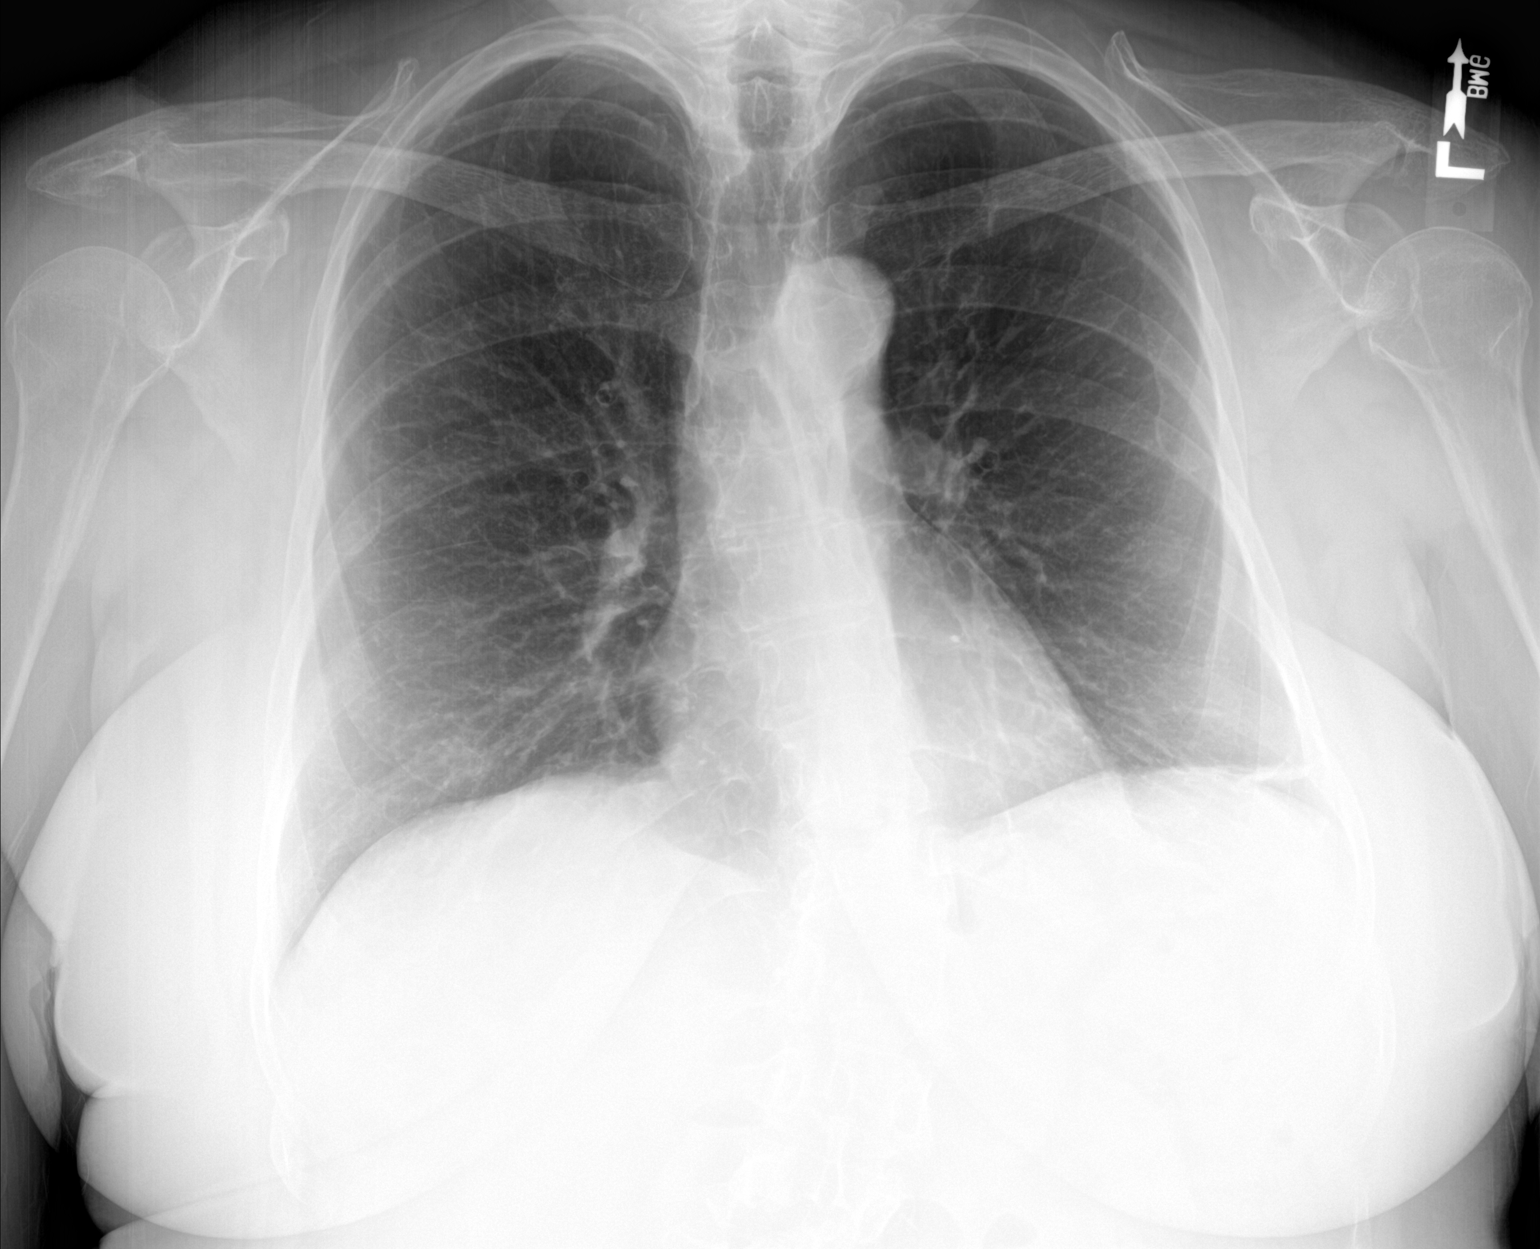

[chest lat]
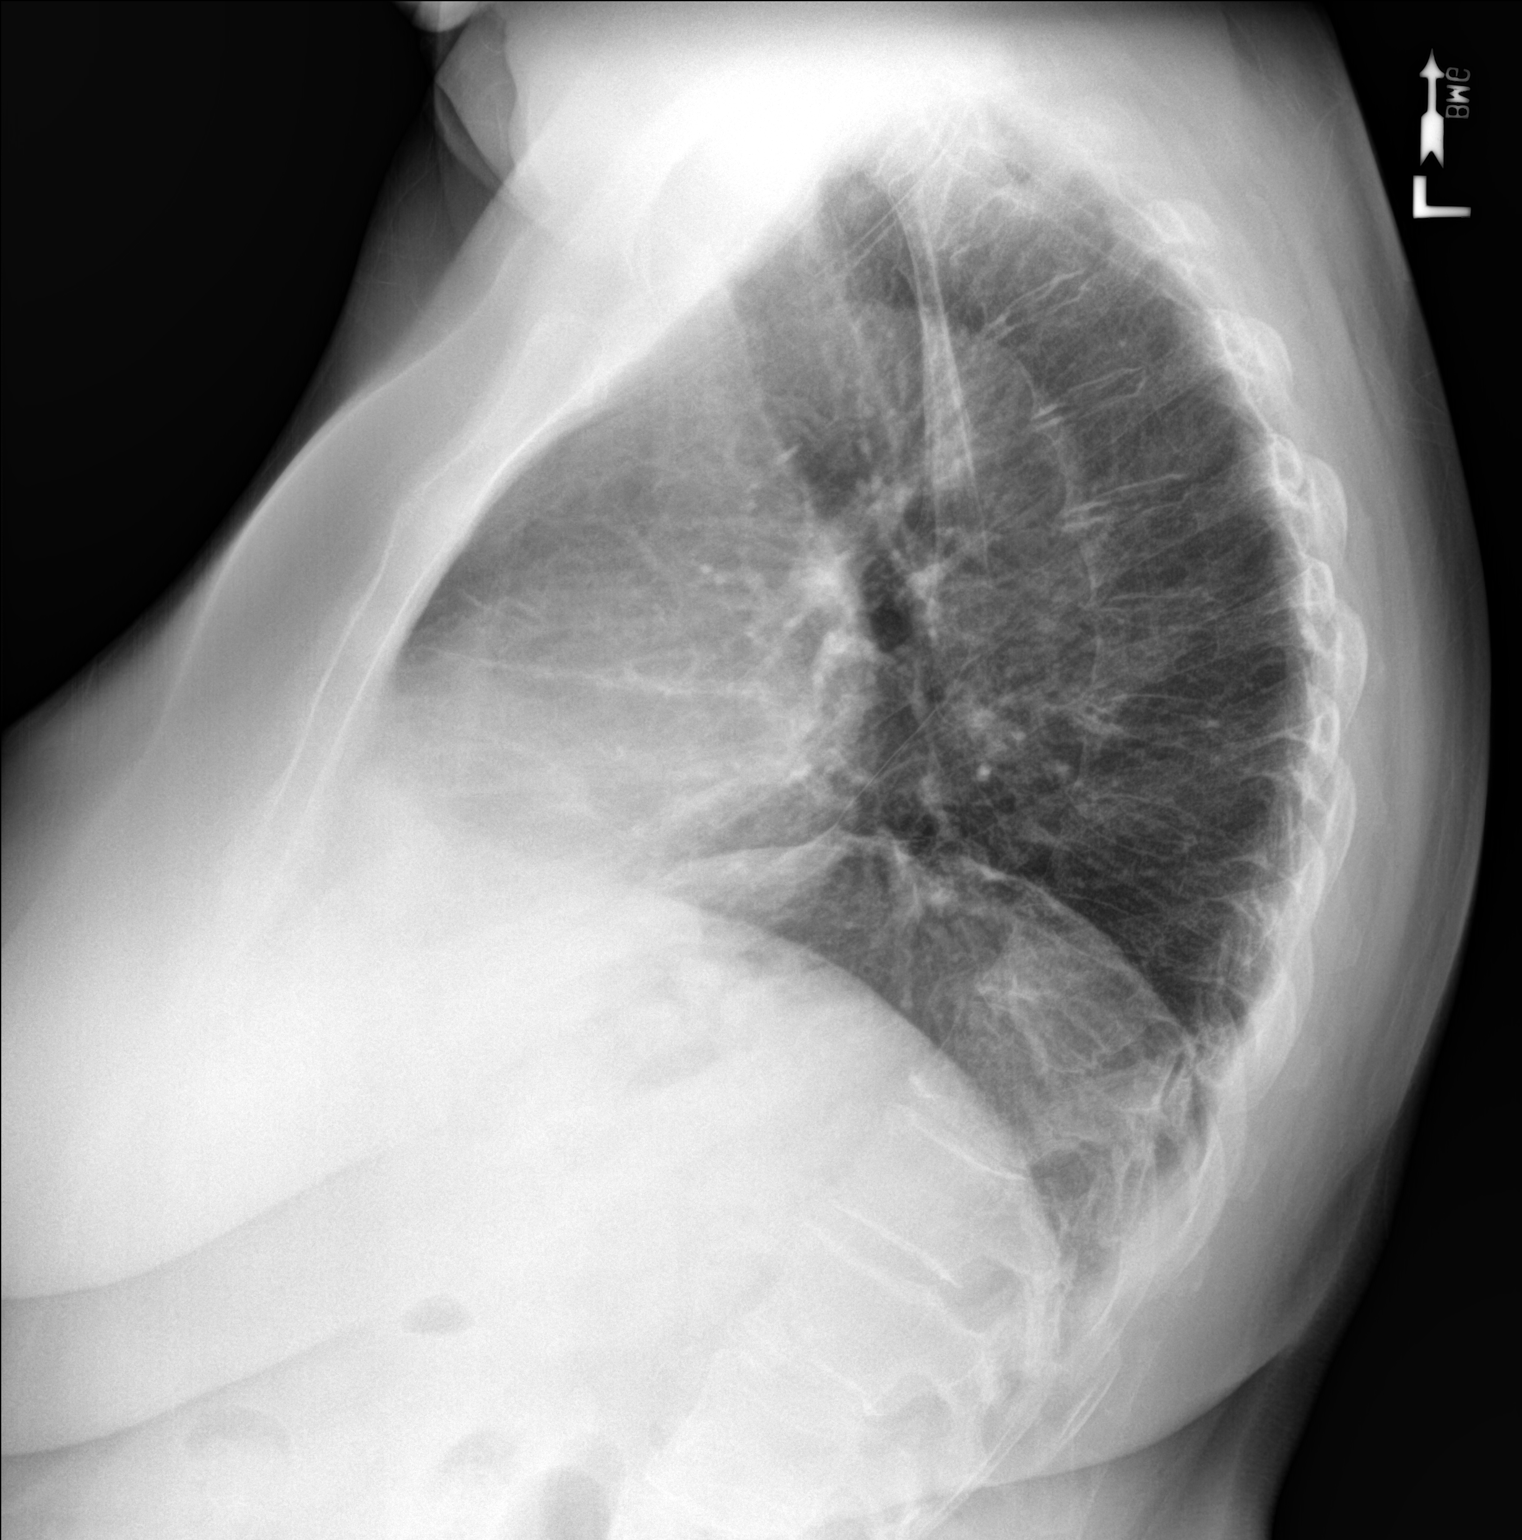

[2 of 2 positions shown; findings below may reference images not displayed]

FINDINGS: Heart size and mediastinal contours are within normal limits.
Atherosclerotic changes noted at the aortic arch.

Mild scarring/atelectasis at the left lung base. Lungs are otherwise
clear. No pleural effusion or pneumothorax seen.

Mild degenerative changes throughout the scoliotic and kyphotic
thoracolumbar spine. Mild compression deformities within the mid and
lower thoracic spine are of uncertain age, favored to be chronic.
IMPRESSION: 1. No active cardiopulmonary disease. No evidence of pneumonia or
pulmonary edema.
2. Aortic atherosclerosis.
3. Scoliosis and kyphosis of the thoracolumbar spine. Mild
compression deformities of several vertebral bodies in the mid and
lower thoracic spine, of uncertain age, most likely chronic.
